# Patient Record
Sex: Male | Born: 1945 | Race: White | Hispanic: No | Marital: Married
Health system: Midwestern US, Academic
[De-identification: ages and names within clinical notes are randomized; demographics above are authoritative.]

---

## 2017-03-28 ENCOUNTER — Encounter: Admit: 2017-03-28 | Discharge: 2017-03-29

## 2017-03-28 DIAGNOSIS — R69 Illness, unspecified: Principal | ICD-10-CM

## 2017-05-30 ENCOUNTER — Encounter: Admit: 2017-05-30 | Discharge: 2017-05-31

## 2017-05-30 DIAGNOSIS — R69 Illness, unspecified: Principal | ICD-10-CM

## 2017-06-03 ENCOUNTER — Encounter: Admit: 2017-06-03 | Discharge: 2017-06-04

## 2017-06-03 DIAGNOSIS — R69 Illness, unspecified: Principal | ICD-10-CM

## 2017-06-30 ENCOUNTER — Encounter: Admit: 2017-06-30 | Discharge: 2017-06-30

## 2017-06-30 DIAGNOSIS — R69 Illness, unspecified: Principal | ICD-10-CM

## 2017-07-01 ENCOUNTER — Encounter: Admit: 2017-07-01 | Discharge: 2017-07-01

## 2017-07-01 DIAGNOSIS — R69 Illness, unspecified: Principal | ICD-10-CM

## 2017-07-08 ENCOUNTER — Encounter: Admit: 2017-07-08 | Discharge: 2017-07-08

## 2017-07-08 NOTE — Telephone Encounter
 Navigation Intake Assessment Document    Patient Name:  Chris Lambert  DOB:  11/04/1945  Insurance:   Francine Graven    Appointment Info:    Future Appointments  Date Time Provider Department Center   07/11/2017 8:30 AM Earl Many, MD [facility]      Diagnosis & Reason for Visit:  Right renal mass     Physician Info:  ??? Referring Physician:  Benito Mccreedy, MD   ??? PCP:  Dionne Milo, MD     Location of Films:  PACS    Location of Pathology:  None    History of Present Illness:    - 71 y/o male who is a former smoker in for CT lung screening and lesions found in both kidneys. Further imaging completed US renal and CT scan abd/pelvis, which noted a large mass in the right kidney which is concerning for renal cell carcinoma.   - 03/28/2017  CT SCAN LUNG SCREENING - Findings: Exophytic cystic-like lesions or masses are present in the upper poles of the kidneys which were also present on the prior CT scan. There is a dominant lesion at the left upper renal pole measuring approximately 29 x 26 mm in dimension which is minimally changed in size from prior study though the attenuation is mildly increased with the Hounsfield unit attenuation documented at 44.  Impression:  Lung RADS category is L1. No suspicious pulmonary nodule or mass. Mild centrilobular emphysema.   - 05/30/2017  US RENAL - Large solid mass at the right lower renal pole. Renal cell carcinoma is the diagnosis of exclusion, though the Wilms tumor could result in a similar appearance. Multiple bilateral renal cysts. No hydronephrosis. There is extensive postvoid residual in the urinary bladder. Prostate gland shows mass effect on the posterior inferior urinary bladder wall.   - 06/03/2017  CT SCAN ABD/PELVIS - There is a dominant mass in the lower pole of the right kidney corresponding to the solid mass lesion seen on the prior ultrasound. This is likely to represent a renal cell carcinoma. There are numerous hyperdense, isodense and hypodense renal cortical cysts bilaterally. There is no hydronephrosis.  There is no evidence of significant lymph node enlargement. There is cholelithiasis with small stones seen within the contracted gallbladder. There are degenerative changes of the lumbar spine. There is a low density area within the medial segment of the left hepatic lobe which  is nonspecific in appearance. A liver mass cannot be entirely ruled out.    Referred to [facility] for further evaluation and treatment. Past medical history includes CVA, HTN, GERD, HLD, COPD, CKD and CAD    Comments:  Fareedah Mahler R. , MS, RN spoke with patient and informed him of date, time and location of appointment. Patient verbalized understanding.Scheduling letter sent to home address via [employer].

## 2017-07-11 ENCOUNTER — Encounter: Admit: 2017-07-11 | Discharge: 2017-07-11 | Payer: MEDICARE

## 2017-07-11 ENCOUNTER — Encounter: Admit: 2017-07-11 | Discharge: 2017-07-11

## 2017-07-11 ENCOUNTER — Ambulatory Visit: Admit: 2017-07-11 | Discharge: 2017-07-12 | Payer: MEDICARE

## 2017-07-11 DIAGNOSIS — I639 Cerebral infarction, unspecified: ICD-10-CM

## 2017-07-11 DIAGNOSIS — I10 Essential (primary) hypertension: Principal | ICD-10-CM

## 2017-07-11 DIAGNOSIS — N2889 Other specified disorders of kidney and ureter: Principal | ICD-10-CM

## 2017-07-11 DIAGNOSIS — I519 Heart disease, unspecified: ICD-10-CM

## 2017-07-11 DIAGNOSIS — R972 Elevated prostate specific antigen [PSA]: ICD-10-CM

## 2017-07-11 DIAGNOSIS — J438 Other emphysema: ICD-10-CM

## 2017-07-11 MED ORDER — CEFAZOLIN INJ 1GM IVP
2 g | Freq: Once | INTRAVENOUS | 0 refills | Status: CN
Start: 2017-07-11 — End: ?

## 2017-07-11 NOTE — Assessment & Plan Note
I went through the cross sectional image findings with the patient and family.  We discussed the implications of the renal masses and the natural history of kidney cancer.  I explained that there is a high chance that the renal mass is kidney cancer. We went over an extensive discussion about the potential management of the renal mass, specifically partial nephrectomy (robotic, laparoscopic, open) and radical nephrectomy. We discussed that our goal will be to do this robotically and only removing the part of the kidney that is affected. The risks and benefits were discussed, he would like to proceed.  - to OR on 07/26/17 for right robotic partial nephrectomy, possible radical, possible open. Patient consented, case requested.

## 2017-07-11 NOTE — Progress Notes
Date of Service: 07/11/2017     Subjective:             Chris Lambert is a 71 y.o. male who presents for evaluation for right renal mass.    History of Present Illness  Patient is a 71 y.o. male w/ PMHx of COPD/emphysema, stroke, CKD, CAD s/p CABGx4, HTN and former smoker who was found to have lesions in both kidneys during a CT scan for lung screening. He follows Dr. Wyvonnia Lora at [facility] in Cardiology. Further imaging completed US renal and CT scan abd/pelvis, which noted a large mass in the right kidney concerning for renal cell carcinoma. On most recent CT scan on 06/03/17, the dominant mass in the right lower pole of kidney was again demonstrated, measuring about 6.8cm x 6cm. Today, he reports occasional left flank pain and fatigue but denies hematuria, fevers, weight loss, night sweats or bone pain. He denies a FHx of GU cancer. He has not had any surgeries other than CABG x 4. He is taking ASA 81mg  daily.      His most recent PSA was 6.78 on 12/07/16. He follows Dr. Lorriane Shire for this. 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 He is undergoing phlebotomies PRN for management. Cr 06/02/17 on 1.74.     Past Medical History:   Diagnosis Date   ??? Elevated PSA    ??? Heart disease    ??? Hypertension    ??? Other emphysema (HCC)    ??? Stroke [facility])        Past Surgical History:   Procedure Laterality Date   ??? HX CORONARY ARTERY BYPASS GRAFT         Family History   Problem Relation Age of Onset   ??? Cancer Mother    ??? Hypertension Mother    ??? Diabetes Sister    ??? Diabetes Maternal Grandmother    ??? Hypertension Maternal Grandmother    ??? Heart Disease Other         Mat. Uncles    ??? Hypertension Other        Current Outpatient Prescriptions   Medication Sig Dispense Refill   ??? ADVAIR HFA 230-21 mcg/actuation inhaler INHALE 2 PUFFS TWICE DAILY RINSE MOUTH AND THROAT AFTER USE  0   ??? amLODIPine (NORVASC) 10 mg tablet ??? aspirin-calcium carbonate 81 mg-300 mg calcium(777 mg) tab Take  by mouth.     ??? atenolol (TENORMIN) 100 mg tablet      ??? atorvastatin (LIPITOR) 20 mg tablet Take  by mouth.     ??? docusate (COLACE) 100 mg capsule Take 100 mg by mouth twice daily.     ??? finasteride (PROSCAR) 5 mg tablet      ??? furosemide (LASIX) 20 mg tablet      ??? hydroxyurea (HYDREA) 500 mg capsule Take 500 mg by mouth daily. Take at the same time every day.     ??? ipratropium/albuterol (COMBIVENT RESPIMAT) 20-100 mcg/actuation mist inhaler Inhale 1 puff by mouth into the lungs four times daily.     ??? losartan (COZAAR) 25 mg tablet Take  by mouth.     ??? multivit with minerals/lutein (MULTIVITAMIN 50 PLUS PO) Take  by mouth.     ??? ranitidine(+) (ZANTAC) 150 mg tablet Take  by mouth.     ??? tamsulosin (FLOMAX) 0.4 mg capsule      ??? traMADol (ULTRAM) 50 mg tablet Take  by mouth.  No current facility-administered medications for this visit.        No Known Allergies    Social History     Social History   ??? Marital status: Married     Spouse name: N/A   ??? Number of children: N/A   ??? Years of education: N/A     Occupational History   ??? Not on file.     Social History Main Topics   ??? Smoking status: Former Smoker     Quit date: 2012   ??? Smokeless tobacco: Never Used   ??? Alcohol use No      Comment: Quit in 1986    ??? Drug use: No   ??? Sexual activity: Not Currently     Partners: Female     Other Topics Concern   ??? Not on file     Social History Narrative   ??? No narrative on file       Review of Systems   Constitutional: Positive for fever. Negative for activity change, appetite change, chills, diaphoresis, fatigue and unexpected weight change.   HENT: Negative for congestion, hearing loss, mouth sores and sinus pressure.    Eyes: Negative for visual disturbance.   Respiratory: Negative for apnea, cough, chest tightness and shortness of breath.    Cardiovascular: Negative for chest pain, palpitations and leg swelling. Gastrointestinal: Negative for abdominal pain, blood in stool, constipation, diarrhea, nausea, rectal pain and vomiting.   Endocrine: Negative.    Genitourinary: Positive for flank pain. Negative for decreased urine volume, difficulty urinating, discharge, dysuria, enuresis, frequency, genital sores, hematuria, penile pain, penile swelling, scrotal swelling, testicular pain and urgency.   Musculoskeletal: Negative for arthralgias, back pain, gait problem and myalgias.   Skin: Negative for rash and wound.   Allergic/Immunologic: Negative.    Neurological: Negative for dizziness, tremors, seizures, syncope, weakness, light-headedness, numbness and headaches.   Hematological: Negative for adenopathy. Does not bruise/bleed easily.   Psychiatric/Behavioral: Negative for decreased concentration and dysphoric mood. The patient is not nervous/anxious.        Objective:         ??? ADVAIR HFA 230-21 mcg/actuation inhaler INHALE 2 PUFFS TWICE DAILY RINSE MOUTH AND THROAT AFTER USE   ??? amLODIPine (NORVASC) 10 mg tablet    ??? aspirin-calcium carbonate 81 mg-300 mg calcium(777 mg) tab Take  by mouth.   ??? atenolol (TENORMIN) 100 mg tablet    ??? atorvastatin (LIPITOR) 20 mg tablet Take  by mouth.   ??? docusate (COLACE) 100 mg capsule Take 100 mg by mouth twice daily.   ??? finasteride (PROSCAR) 5 mg tablet    ??? furosemide (LASIX) 20 mg tablet    ??? hydroxyurea (HYDREA) 500 mg capsule Take 500 mg by mouth daily. Take at the same time every day.   ??? ipratropium/albuterol (COMBIVENT RESPIMAT) 20-100 mcg/actuation mist inhaler Inhale 1 puff by mouth into the lungs four times daily.   ??? losartan (COZAAR) 25 mg tablet Take  by mouth.   ??? multivit with minerals/lutein (MULTIVITAMIN 50 PLUS PO) Take  by mouth.   ??? ranitidine(+) (ZANTAC) 150 mg tablet Take  by mouth.   ??? tamsulosin (FLOMAX) 0.4 mg capsule    ??? traMADol (ULTRAM) 50 mg tablet Take  by mouth.     Vitals:    07/11/17 0813   BP: 145/69   Pulse: 76   Weight: 98.9 kg (218 lb) Height: 177.8 cm (70)     Body mass index is 31.28 kg/m???.  Physical Exam   Constitutional: He is oriented to person, place, and time. He appears well-developed and well-nourished. No distress.   Obese   HENT:   Head: Normocephalic and atraumatic.   Nose: Nose normal.   Eyes: Pupils are equal, round, and reactive to light. Conjunctivae and EOM are normal. Right eye exhibits no discharge. Left eye exhibits no discharge. No scleral icterus.   Neck: Normal range of motion. No tracheal deviation present.   Cardiovascular: Normal rate and intact distal pulses.    Pulmonary/Chest: Effort normal and breath sounds normal. No stridor. No respiratory distress. He exhibits no tenderness.   Abdominal: Soft. Bowel sounds are normal. He exhibits no distension and no mass. There is no tenderness. There is no rebound and no guarding.   Umbilical hernia noted on exam, reducible.   Musculoskeletal: Normal range of motion. He exhibits no edema or deformity.   Neurological: He is alert and oriented to person, place, and time.   Skin: Skin is warm and dry. He is not diaphoretic. No pallor.   Psychiatric: He has a normal mood and affect. His behavior is normal. Judgment and thought content normal.        Assessment and Plan:    Problem   Right Renal Mass    - 03/28/2017  CT SCAN LUNG SCREENING - Findings: Exophytic cystic-like lesions or masses are present in the upper poles of the kidneys which were also present on the prior CT scan. There is a dominant lesion at the left upper renal pole measuring approximately 29 x 26 mm in dimension which is minimally changed in size from prior study though the attenuation is mildly increased with the Hounsfield unit attenuation documented at 44.  Impression:  Lung RADS category is L1. No suspicious pulmonary nodule or mass. Mild centrilobular emphysema.   - 05/30/2017  US RENAL - Large solid mass at the right lower renal pole. Renal cell carcinoma is the diagnosis of exclusion, though the Wilms tumor could result in a similar appearance. Multiple bilateral renal cysts. No hydronephrosis. There is extensive postvoid residual in the urinary bladder. Prostate gland shows mass effect on the posterior inferior urinary bladder wall.   - 06/03/2017  CT SCAN ABD/PELVIS - There is a dominant mass in the lower pole of the right kidney corresponding to the solid mass lesion seen on the prior ultrasound. This is likely to represent a renal cell carcinoma. There are numerous hyperdense, isodense and hypodense renal cortical cysts bilaterally. There is no hydronephrosis.  There is no evidence of significant lymph node enlargement. There is cholelithiasis with small stones seen within the contracted gallbladder. There are degenerative changes of the lumbar spine. There is a low density area within the medial segment of the left hepatic lobe which  is nonspecific in appearance. A liver mass cannot be entirely ruled out.         Right renal mass  I went through the cross sectional image findings with the patient and family.  We discussed the implications of the renal masses and the natural history of kidney cancer.  I explained that there is a high chance that the renal mass is kidney cancer. We went over an extensive discussion about the potential management of the renal mass, specifically partial nephrectomy (robotic, laparoscopic, open) and radical nephrectomy. We discussed that our goal will be to do this robotically and only removing the part of the kidney that is affected. The risks and benefits were discussed, he would like to  proceed.  - to OR on 07/26/17 for right robotic partial nephrectomy, possible radical, possible open. Patient consented, case requested.          Patient seen and discussed with Dr. Perlie Gold, who directed plan of care.    Minerva Ends, MD  PGY2 Urology       ATTESTATION I personally performed the key portions of the E/M visit, discussed case with resident and concur with resident documentation of history, physical exam, assessment, and treatment plan unless otherwise noted.    Patient referred to me by Dr. Larwance Rote for further evaluation and treatment of a newly discovered right renal mass concerning for renal cell carcinoma.  We discussed the findings of the mass, the differential diagnosis, and treatment options.  He does have baseline renal insufficiency (baseline creatinine 1.7-1.8).  The mass is on the larger size of masses amenable to a partial nephrectomy, but it is in a favorable location.  He also has multiple cysts on his bilateral kidneys.  After discussion, patient would like to proceed with a right robotic versus open partial nephrectomy with possible radical nephrectomy.  We will also perform some cyst decortications.  He will need cardiology clearance.  All risks and benefits of the procedure were discussed with the patient.  All questions answered.  Consent obtained.  Complex counseling.  We will schedule surgery in the near future.    Staff name:  Earl Many, MD Date:  07/11/2017

## 2017-07-12 DIAGNOSIS — N2889 Other specified disorders of kidney and ureter: Secondary | ICD-10-CM

## 2017-07-12 LAB — CULTURE-URINE W/SENSITIVITY

## 2017-07-21 ENCOUNTER — Encounter: Admit: 2017-07-21 | Discharge: 2017-07-21

## 2017-07-21 ENCOUNTER — Ambulatory Visit: Admit: 2017-07-21 | Discharge: 2017-07-22 | Payer: MEDICARE

## 2017-07-21 DIAGNOSIS — I639 Cerebral infarction, unspecified: ICD-10-CM

## 2017-07-21 DIAGNOSIS — N2889 Other specified disorders of kidney and ureter: ICD-10-CM

## 2017-07-21 DIAGNOSIS — I10 Essential (primary) hypertension: Principal | ICD-10-CM

## 2017-07-21 DIAGNOSIS — N189 Chronic kidney disease, unspecified: ICD-10-CM

## 2017-07-21 DIAGNOSIS — R972 Elevated prostate specific antigen [PSA]: ICD-10-CM

## 2017-07-21 DIAGNOSIS — I519 Heart disease, unspecified: ICD-10-CM

## 2017-07-21 DIAGNOSIS — E785 Hyperlipidemia, unspecified: ICD-10-CM

## 2017-07-21 DIAGNOSIS — J438 Other emphysema: ICD-10-CM

## 2017-07-21 DIAGNOSIS — D751 Secondary polycythemia: ICD-10-CM

## 2017-07-21 DIAGNOSIS — K929 Disease of digestive system, unspecified: ICD-10-CM

## 2017-07-21 DIAGNOSIS — I499 Cardiac arrhythmia, unspecified: ICD-10-CM

## 2017-07-21 DIAGNOSIS — I2510 Atherosclerotic heart disease of native coronary artery without angina pectoris: ICD-10-CM

## 2017-07-21 LAB — COMPREHENSIVE METABOLIC PANEL: Lab: 136 MMOL/L — ABNORMAL LOW (ref 137–147)

## 2017-07-21 LAB — CBC
Lab: 14 g/dL — ABNORMAL HIGH (ref 13.5–16.5)
Lab: 4.4 M/UL (ref 4.4–5.5)
Lab: 44 % — ABNORMAL HIGH (ref 40–50)
Lab: 9.4 10*3/uL (ref 4.5–11.0)

## 2017-07-21 NOTE — Pre-Anesthesia Patient Instructions
GENERAL INFORMATION    Before you come to the hospital  ??? Make arrangements for a responsible adult to drive you home and stay with you for 24 hours following surgery.  ??? Bath/Shower Instructions  ??? Take a bath or shower using the special soap given to you in PAC. Use half the bottle the night before, and the other half the morning of your procedure. Use clean towels with each bath or shower.  ??? Put on clean clothes after bath or shower.  Avoid using lotion and oils.  ??? Sleep on clean sheets if bath or shower is done the night before procedure.  ??? Leave money, credit cards, jewelry, and any other valuables at home. The [facility] is not responsible for the loss or breakage of personal items.  ??? Remove nail polish, makeup and all jewelry (including piercings) before coming to the hospital.  ??? The morning of your procedure:  ??? brush your teeth and tongue  ??? do not smoke  ??? do not shave the area where you will have surgery    What to bring to the hospital  ??? ID/ Insurance Card  ??? Medical Device card  ??? Copy of your Living Will, Advanced Directives, and/or Durable Power of Attorney   ??? Small bag with a few personal belongings  ??? CPAP/BiPAP machine (including all supplies)  ??? Walker,cane, or motorized scooter  ??? Cases for glasses/hearing aids/contact lens (bring solutions for contacts)  ??? ***  ??? Dress in clean, loose, comfortable clothing     Eating or drinking before surgery  ??? Do not eat or drink anything after 11:00 p.m. the day before your procedure (including gum, mints, candy, or chewing tobacco) OR follow the specific instructions you were given by your Surgeon.  ??? You may have WATER ONLY up to 2 hours before arriving at the hospital.       Other instructions  Notify your surgeon if:    ??? you become ill with a cough, fever, sore throat, nausea, vomiting or flu-like symptoms  ??? you have any open wounds/sores that are red, painful, draining, or are new since you last saw  the doctor ??? you need to cancel your procedure  ??? You will receive a call with your surgery arrival time from between 2:30pm and 4:30pm the last business day before your procedure.  If you do not receive a call, please call [phone] before 4:30pm or [phone] after 4:30pm.    Notify us at [facility]: [phone]  ??? if you need to cancel your procedure  ??? if you are going to be late    Arrival at the hospital    [facility]  [address]  [place], [place] [address]    ??? Park in the [employer], located directly across from the main entrance to the hospital.  ??? Valet parking is available  from 7 AM to 4 PM Monday through Friday.  ??? Enter through the ground floor main hospital entrance and check in at the Information Desk in the lobby.  ??? They will validate your parking ticket and direct you to the next location.  ??? If you are a woman between the ages of 8 and 61, and have not had a hysterectomy, you will be asked for a urine sample prior to surgery.  Please do not urinate before arriving in the Surgery Waiting Room.  Once there, check in and let the attendant know if you need  to provide a sample.

## 2017-07-22 DIAGNOSIS — Z01810 Encounter for preprocedural cardiovascular examination: ICD-10-CM

## 2017-07-22 DIAGNOSIS — N2889 Other specified disorders of kidney and ureter: Principal | ICD-10-CM

## 2017-07-22 NOTE — Progress Notes
[facility] Pharmacist Medication Plan Note:    Chris Lambert was seen in the [facility] on 07/21/17.  As part of the visit, an accurate medication list was obtained and the patient was given pre-op medication instructions for upcoming surgery on 07/26/17.      Per Renee with Dr. Lolita Lenz and Echo with Dr. Wonda Cerise, the patient may continue to hold aspirin prior to surgery. Last dose was on 07/11/17. Per Echo with Dr. Wonda Cerise, the patient may continue to hold hydroxyurea prior to surgery. Last dose was on 07/11/17.    The plan above was communicated to the patient who verbalized understanding.    Hamilton Capri, PHARMD

## 2017-07-26 ENCOUNTER — Encounter: Admit: 2017-07-26 | Discharge: 2017-07-26

## 2017-07-26 DIAGNOSIS — R972 Elevated prostate specific antigen [PSA]: ICD-10-CM

## 2017-07-26 DIAGNOSIS — N2889 Other specified disorders of kidney and ureter: ICD-10-CM

## 2017-07-26 DIAGNOSIS — I639 Cerebral infarction, unspecified: ICD-10-CM

## 2017-07-26 DIAGNOSIS — I499 Cardiac arrhythmia, unspecified: ICD-10-CM

## 2017-07-26 DIAGNOSIS — J438 Other emphysema: ICD-10-CM

## 2017-07-26 DIAGNOSIS — I10 Essential (primary) hypertension: Principal | ICD-10-CM

## 2017-07-26 DIAGNOSIS — D751 Secondary polycythemia: ICD-10-CM

## 2017-07-26 DIAGNOSIS — E785 Hyperlipidemia, unspecified: ICD-10-CM

## 2017-07-26 DIAGNOSIS — I2510 Atherosclerotic heart disease of native coronary artery without angina pectoris: ICD-10-CM

## 2017-07-26 DIAGNOSIS — N189 Chronic kidney disease, unspecified: ICD-10-CM

## 2017-07-26 DIAGNOSIS — I519 Heart disease, unspecified: ICD-10-CM

## 2017-07-26 DIAGNOSIS — K929 Disease of digestive system, unspecified: ICD-10-CM

## 2017-07-26 MED ORDER — DIPHENHYDRAMINE HCL 50 MG/ML IJ SOLN
25 mg | Freq: Once | INTRAVENOUS | 0 refills | Status: DC | PRN
Start: 2017-07-26 — End: 2017-07-27

## 2017-07-26 MED ORDER — FENTANYL CITRATE (PF) 50 MCG/ML IJ SOLN
0 refills | Status: DC
Start: 2017-07-26 — End: 2017-07-27
  Administered 2017-07-26: 19:00:00 50 ug via INTRAVENOUS
  Administered 2017-07-26: 25 ug via INTRAVENOUS
  Administered 2017-07-26: 22:00:00 50 ug via INTRAVENOUS
  Administered 2017-07-26: 25 ug via INTRAVENOUS

## 2017-07-26 MED ORDER — LIDOCAINE (PF) 10 MG/ML (1 %) IJ SOLN
.1-2 mL | INTRAMUSCULAR | 0 refills | Status: DC | PRN
Start: 2017-07-26 — End: 2017-07-27

## 2017-07-26 MED ORDER — HYDROXYUREA 500 MG PO CAP
500 mg | Freq: Every day | ORAL | 0 refills | Status: DC
Start: 2017-07-26 — End: 2017-07-29
  Administered 2017-07-27 – 2017-07-29 (×3): 500 mg via ORAL

## 2017-07-26 MED ORDER — TRAMADOL 50 MG PO TAB
50 mg | ORAL | 0 refills | Status: DC | PRN
Start: 2017-07-26 — End: 2017-07-29
  Administered 2017-07-27 – 2017-07-29 (×3): 50 mg via ORAL

## 2017-07-26 MED ORDER — HALOPERIDOL LACTATE 5 MG/ML IJ SOLN
1 mg | Freq: Once | INTRAVENOUS | 0 refills | Status: DC | PRN
Start: 2017-07-26 — End: 2017-07-27

## 2017-07-26 MED ORDER — FENTANYL CITRATE (PF) 50 MCG/ML IJ SOLN
50 ug | INTRAVENOUS | 0 refills | Status: DC | PRN
Start: 2017-07-26 — End: 2017-07-27
  Administered 2017-07-27: 01:00:00 50 ug via INTRAVENOUS
  Administered 2017-07-27: 01:00:00 25 ug via INTRAVENOUS
  Administered 2017-07-27: 01:00:00 50 ug via INTRAVENOUS
  Administered 2017-07-27 (×3): 25 ug via INTRAVENOUS

## 2017-07-26 MED ORDER — HYDROMORPHONE (PF) 2 MG/ML IJ SYRG
.5 mg | INTRAVENOUS | 0 refills | Status: DC | PRN
Start: 2017-07-26 — End: 2017-07-27

## 2017-07-26 MED ORDER — PROMETHAZINE 25 MG/ML IJ SOLN
6.25 mg | INTRAVENOUS | 0 refills | Status: DC | PRN
Start: 2017-07-26 — End: 2017-07-27

## 2017-07-26 MED ORDER — FENTANYL CITRATE (PF) 50 MCG/ML IJ SOLN
25-50 ug | INTRAVENOUS | 0 refills | Status: DC | PRN
Start: 2017-07-26 — End: 2017-07-27
  Administered 2017-07-27: 03:00:00 50 ug via INTRAVENOUS

## 2017-07-26 MED ORDER — ROCURONIUM 10 MG/ML IV SOLN
INTRAVENOUS | 0 refills | Status: DC
Start: 2017-07-26 — End: 2017-07-27
  Administered 2017-07-26 (×3): 10 mg via INTRAVENOUS
  Administered 2017-07-26: 19:00:00 50 mg via INTRAVENOUS
  Administered 2017-07-26 (×2): 10 mg via INTRAVENOUS

## 2017-07-26 MED ORDER — CEFAZOLIN INJ 1GM IVP
1 g | INTRAVENOUS | 0 refills | Status: CP
Start: 2017-07-26 — End: ?
  Administered 2017-07-27 (×3): 1 g via INTRAVENOUS

## 2017-07-26 MED ORDER — OXYCODONE 5 MG PO TAB
5-10 mg | Freq: Once | ORAL | 0 refills | Status: CP | PRN
Start: 2017-07-26 — End: ?
  Administered 2017-07-27: 10 mg via ORAL

## 2017-07-26 MED ORDER — SODIUM CHLORIDE 0.9 % IV SOLP
1000 mL | INTRAVENOUS | 0 refills | Status: DC
Start: 2017-07-26 — End: 2017-07-27

## 2017-07-26 MED ORDER — SENNOSIDES-DOCUSATE SODIUM 8.6-50 MG PO TAB
1 | Freq: Two times a day (BID) | ORAL | 0 refills | Status: DC
Start: 2017-07-26 — End: 2017-07-29
  Administered 2017-07-27 – 2017-07-29 (×4): 1 via ORAL

## 2017-07-26 MED ORDER — ATENOLOL 50 MG PO TAB
100 mg | Freq: Every day | ORAL | 0 refills | Status: DC
Start: 2017-07-26 — End: 2017-07-29
  Administered 2017-07-27 – 2017-07-29 (×3): 100 mg via ORAL

## 2017-07-26 MED ORDER — ATORVASTATIN 20 MG PO TAB
20 mg | Freq: Every evening | ORAL | 0 refills | Status: DC
Start: 2017-07-26 — End: 2017-07-29
  Administered 2017-07-27 – 2017-07-29 (×3): 20 mg via ORAL

## 2017-07-26 MED ORDER — LIDOCAINE (PF) 200 MG/10 ML (2 %) IJ SYRG
0 refills | Status: DC
Start: 2017-07-26 — End: 2017-07-27
  Administered 2017-07-26: 19:00:00 80 mg via INTRAVENOUS

## 2017-07-26 MED ORDER — DEXTRAN 70-HYPROMELLOSE (PF) 0.1-0.3 % OP DPET
0 refills | Status: DC
Start: 2017-07-26 — End: 2017-07-27
  Administered 2017-07-26: 19:00:00 2 [drp] via OPHTHALMIC

## 2017-07-26 MED ORDER — ACETAMINOPHEN 325 MG PO TAB
650 mg | Freq: Once | ORAL | 0 refills | Status: CP
Start: 2017-07-26 — End: ?
  Administered 2017-07-26: 18:00:00 650 mg via ORAL

## 2017-07-26 MED ORDER — ALBUTEROL SULFATE 2.5 MG/0.5 ML IN NEBU
2.5 mg | RESPIRATORY_TRACT | 0 refills | Status: DC | PRN
Start: 2017-07-26 — End: 2017-07-29

## 2017-07-26 MED ORDER — PROPOFOL INJ 10 MG/ML IV VIAL
0 refills | Status: DC
Start: 2017-07-26 — End: 2017-07-27
  Administered 2017-07-26: 19:00:00 40 mg via INTRAVENOUS
  Administered 2017-07-26: 19:00:00 100 mg via INTRAVENOUS

## 2017-07-26 MED ORDER — LACTATED RINGERS IV SOLP
0 refills | Status: DC
Start: 2017-07-26 — End: 2017-07-27
  Administered 2017-07-26: 23:00:00 via INTRAVENOUS

## 2017-07-26 MED ORDER — LOSARTAN 25 MG PO TAB
12.5 mg | Freq: Every day | ORAL | 0 refills | Status: DC
Start: 2017-07-26 — End: 2017-07-27

## 2017-07-26 MED ORDER — ONDANSETRON HCL (PF) 4 MG/2 ML IJ SOLN
4-8 mg | INTRAVENOUS | 0 refills | Status: DC | PRN
Start: 2017-07-26 — End: 2017-07-29
  Administered 2017-07-27 – 2017-07-28 (×7): 4 mg via INTRAVENOUS

## 2017-07-26 MED ORDER — POLYETHYLENE GLYCOL 3350 17 GRAM PO PWPK
1 | Freq: Two times a day (BID) | ORAL | 0 refills | Status: DC
Start: 2017-07-26 — End: 2017-07-29
  Administered 2017-07-27 – 2017-07-29 (×6): 17 g via ORAL

## 2017-07-26 MED ORDER — TAMSULOSIN 0.4 MG PO CAP
.4 mg | Freq: Every day | ORAL | 0 refills | Status: DC
Start: 2017-07-26 — End: 2017-07-29
  Administered 2017-07-27 – 2017-07-28 (×3): 0.4 mg via ORAL

## 2017-07-26 MED ORDER — SUGAMMADEX 100 MG/ML IV SOLN
INTRAVENOUS | 0 refills | Status: DC
Start: 2017-07-26 — End: 2017-07-27
  Administered 2017-07-26: 200 mg via INTRAVENOUS

## 2017-07-26 MED ORDER — SODIUM CHLORIDE 0.9 % IV SOLP
INTRAVENOUS | 0 refills | Status: DC
Start: 2017-07-26 — End: 2017-07-29
  Administered 2017-07-27 – 2017-07-28 (×3): 1000.000 mL via INTRAVENOUS

## 2017-07-26 MED ORDER — FAMOTIDINE 20 MG PO TAB
20 mg | Freq: Two times a day (BID) | ORAL | 0 refills | Status: DC
Start: 2017-07-26 — End: 2017-07-28
  Administered 2017-07-27 – 2017-07-28 (×3): 20 mg via ORAL

## 2017-07-26 MED ORDER — GABAPENTIN 100 MG PO CAP
200 mg | Freq: Once | ORAL | 0 refills | Status: CP
Start: 2017-07-26 — End: ?
  Administered 2017-07-26: 18:00:00 200 mg via ORAL

## 2017-07-26 MED ORDER — OXYCODONE 5 MG PO TAB
5-10 mg | ORAL | 0 refills | Status: DC | PRN
Start: 2017-07-26 — End: 2017-07-29
  Administered 2017-07-27 – 2017-07-28 (×6): 10 mg via ORAL
  Administered 2017-07-29: 17:00:00 5 mg via ORAL
  Administered 2017-07-29: 01:00:00 10 mg via ORAL

## 2017-07-26 MED ORDER — FLUTICASONE-SALMETEROL 100-50 MCG/DOSE IN DSDV
1 | Freq: Two times a day (BID) | RESPIRATORY_TRACT | 0 refills | Status: DC
Start: 2017-07-26 — End: 2017-07-29
  Administered 2017-07-27: 04:00:00 1 via RESPIRATORY_TRACT

## 2017-07-26 MED ORDER — ACETAMINOPHEN 325 MG PO TAB
650 mg | ORAL | 0 refills | Status: DC
Start: 2017-07-26 — End: 2017-07-29
  Administered 2017-07-27 – 2017-07-29 (×10): 650 mg via ORAL

## 2017-07-26 MED ORDER — ELECTROLYTE-A IV SOLP
0 refills | Status: DC
Start: 2017-07-26 — End: 2017-07-27
  Administered 2017-07-26 (×2): via INTRAVENOUS

## 2017-07-26 MED ORDER — AMLODIPINE 10 MG PO TAB
10 mg | Freq: Every day | ORAL | 0 refills | Status: DC
Start: 2017-07-26 — End: 2017-07-29
  Administered 2017-07-27 – 2017-07-29 (×3): 10 mg via ORAL

## 2017-07-26 MED ORDER — LACTATED RINGERS IV SOLP
INTRAVENOUS | 0 refills | Status: DC
Start: 2017-07-26 — End: 2017-07-27

## 2017-07-26 MED ORDER — FINASTERIDE 5 MG PO TAB
5 mg | Freq: Every day | ORAL | 0 refills | Status: DC
Start: 2017-07-26 — End: 2017-07-29
  Administered 2017-07-27 – 2017-07-29 (×3): 5 mg via ORAL

## 2017-07-26 MED ORDER — ACETAMINOPHEN 500 MG PO TAB
1000 mg | Freq: Once | ORAL | 0 refills | Status: CP
Start: 2017-07-26 — End: ?
  Administered 2017-07-27: 01:00:00 1000 mg via ORAL

## 2017-07-26 MED ORDER — EPHEDRINE SULFATE 50 MG/5ML SYR (10 MG/ML) (AN)(OSM)
0 refills | Status: DC
Start: 2017-07-26 — End: 2017-07-27
  Administered 2017-07-26 (×2): 10 mg via INTRAVENOUS

## 2017-07-26 MED ORDER — BUPIVACAINE (PF) 0.25 % (2.5 MG/ML) IJ SOLN
0 refills | Status: DC
Start: 2017-07-26 — End: 2017-07-26
  Administered 2017-07-26: 23:00:00 50 mL via INTRAMUSCULAR

## 2017-07-26 MED ORDER — ONDANSETRON HCL (PF) 4 MG/2 ML IJ SOLN
INTRAVENOUS | 0 refills | Status: DC
Start: 2017-07-26 — End: 2017-07-27
  Administered 2017-07-26: 23:00:00 4 mg via INTRAVENOUS

## 2017-07-26 MED ORDER — IPRATROPIUM BROMIDE 0.02 % IN SOLN
0.5 mg | RESPIRATORY_TRACT | 0 refills | Status: DC | PRN
Start: 2017-07-26 — End: 2017-07-29

## 2017-07-26 MED ORDER — CEFAZOLIN INJ 1GM IVP
2 g | Freq: Once | INTRAVENOUS | 0 refills | Status: CP
Start: 2017-07-26 — End: ?
  Administered 2017-07-26 (×2): 2 g via INTRAVENOUS

## 2017-07-26 MED ADMIN — SODIUM CHLORIDE 0.9 % IV SOLP [27838]: 1000 mL | INTRAVENOUS | @ 18:00:00 | Stop: 2017-07-26 | NDC 00338004904

## 2017-07-26 NOTE — Anesthesia Procedure Notes
Anesthesia Procedure: Arterial Line Placement    A-LINE INSERTION  Date/Time: 07/26/2017 1:16 PM    Patient location: OR  Indications: frequent labs and hemodynamic monitoring      Preprocedure checklist performed: 2 patient identifiers, risks & benefits discussed, patient evaluated, timeout performed, consent obtained, patient being monitored and sterile drape    Sterile technique:  - Proper hand washing  - Cap, mask  - Sterile gloves  - Skin prep for antisepsis        Arterial Line Procedure   Patient sedated: yes (see MAR)  Sedation type: general;   Artery prepped with chlorhexidine; skin prep agent completely dried prior to procedure.  Location: radial artery  Laterality: left  Technique: palpation and anatomical landmarks  Needle gauge: 20 G  Number of attempts: 2    Procedure Outcome  Catheter secured with adhesive dressing applied  Events: no complications noted during insertion and skin intact, warm, and dry    Observation: pt tolerated well        Performed by: Fleet Contras, Katianne Barre  Authorized by: Casilda Carls

## 2017-07-27 LAB — BASIC METABOLIC PANEL
Lab: 105 MMOL/L (ref 98–110)
Lab: 11 pg (ref 3–12)
Lab: 139 MMOL/L — ABNORMAL LOW (ref 137–147)
Lab: 153 mg/dL — ABNORMAL HIGH (ref 70–100)
Lab: 2.1 mg/dL — ABNORMAL HIGH (ref 0.4–1.24)
Lab: 23 MMOL/L (ref 21–30)
Lab: 30 mL/min — ABNORMAL LOW (ref >60)
Lab: 31 mg/dL — ABNORMAL HIGH (ref 7–25)
Lab: 37 mL/min — ABNORMAL LOW (ref >60)
Lab: 5 MMOL/L (ref 3.5–5.1)
Lab: 9.4 mg/dL (ref 8.5–10.6)
Lab: 134 MMOL/L — ABNORMAL LOW (ref >60)

## 2017-07-27 LAB — CBC
Lab: 18 10*3/uL — ABNORMAL HIGH (ref 4.5–11.0)
Lab: 12 K/UL — ABNORMAL HIGH (ref >60)

## 2017-07-27 MED ORDER — SODIUM CHLORIDE 0.9 % IV SOLP
1000 mL | INTRAVENOUS | 0 refills | Status: CP
Start: 2017-07-27 — End: ?
  Administered 2017-07-27: 15:00:00 1000 mL via INTRAVENOUS

## 2017-07-27 NOTE — Anesthesia Pain Rounding
Anesthesia Follow-Up Evaluation: Post-Procedure Day One    Name: Chris Lambert     MRN: [id number]     DOB: July 25, 1946     Age: 71 y.o.     Sex: male   __________________________________________________________________________     Procedure Date: 07/26/2017   Procedure: Procedure(s) with comments:  ROBOT-ASSISTED LAPAROSCOPIC RADICAL NEPHRECTOMY; INTRAOPERATIVE ULTRASOUND. - CASE LENGTH 4 HOURS, PLEASE CLIP PT IN SDS/PRE-POST, REQUEST FIREFLY, INTRA-OP SONO, ARGON, FROZEN SECTION REQUIRED    Physical Assessment  Height: 177.8 cm (70)  Weight: 99.1 kg (218 lb 7.6 oz)    Vital Signs (Last Filed in 24 hours)  BP: 137/70 (12/05 0820)  Temp: 36.7 ???C (98.1 ???F) (12/05 0820)  Pulse: 82 (12/05 0820)  Respirations: 20 PER MINUTE (12/05 0820)  SpO2: 92 % (12/05 0820)  O2 Delivery: None (Room Air) (12/05 0820)  SpO2 Pulse: 79 (12/04 1930)  Height: 177.8 cm (70) (12/04 1130)    Patient History   Allergies  No Known Allergies     Medications  Scheduled Meds:  acetaminophen (TYLENOL) tablet 650 mg 650 mg Oral Q6H*   amLODIPine (NORVASC) tablet 10 mg 10 mg Oral QDAY w/breakfast   atenolol (TENORMIN) tablet 100 mg 100 mg Oral QDAY w/breakfast   atorvastatin (LIPITOR) tablet 20 mg 20 mg Oral QHS   ceFAZolin (ANCEF) IVP 1 g 1 g Intravenous Q8H*   famotidine (PEPCID) tablet 20 mg 20 mg Oral BID   finasteride (PROSCAR) tablet 5 mg 5 mg Oral QDAY w/breakfast   fluticasone/salmeterol (ADVAIR DISKUS) 100-50 mcg inhalation disk 1 puff 1 puff Inhalation BID   hydroxyurea (HYDREA) capsule 500 mg 500 mg Oral QDAY w/breakfast   polyethylene glycol 3350 (MIRALAX) packet 17 g 1 packet Oral BID   senna/docusate (SENOKOT-S) tablet 1 tablet 1 tablet Oral BID   tamsulosin (FLOMAX) capsule 0.4 mg 0.4 mg Oral QDAY w/dinner   Continuous Infusions:  ??? sodium chloride 0.9 %   infusion 100 mL/hr at 07/26/17 1840     PRN and Respiratory Meds:albuterol 0.5% Q4H PRN, ipratropium bromide Q4H PRN, ondansetron (ZOFRAN) IV Q4H PRN, oxyCODONE Q6H PRN, traMADol Q6H PRN      Diagnostic Tests  Hematology: Lab Results   Component Value Date    HGB 13.4 07/27/2017    HCT 40.2 07/27/2017    PLTCT 320 07/27/2017    WBC 12.1 07/27/2017    MCV 100.0 07/27/2017    MCH 33.3 07/27/2017    MCHC 33.3 07/27/2017    MPV 8.2 07/27/2017    RDW 16.7 07/27/2017         General Chemistry: Lab Results   Component Value Date    NA 134 07/27/2017    K 4.7 07/27/2017    CL 102 07/27/2017    CO2 23 07/27/2017    GAP 9 07/27/2017    BUN 36 07/27/2017    CR 2.56 07/27/2017    GLU 114 07/27/2017    CA 9.2 07/27/2017    ALBUMIN 4.4 07/21/2017    TOTBILI 0.6 07/21/2017      Coagulation: No results found for: PT, PTT, INR      Follow-Up Assessment  Patient location during evaluation: floor      Anesthetic Complications:   Anesthetic complications: The patient did not experience any anesthestic complications.      Pain:    Management:inadequate (RN managing pain)     Level of Consciousness: awake and alert   Hydration:acceptable     Airway Patency: patent   Respiratory Status:  acceptable     Cardiovascular Status:acceptable   Regional/Neuroaxial:

## 2017-07-27 NOTE — Progress Notes
RT Adult Assessment Note    NAME:Chris Lambert             MRN: [id number]             DOB:[birth year]          AGE: 71 y.o.  ADMISSION DATE: 07/26/2017             DAYS ADMITTED: LOS: 0 days    RT Treatment Plan:  Protocol Plan: Medications  Albuterol/Ipratropium: Neb PRN (pt states this is home regimen)    Protocol Plan: Procedures  IPPB: Place a nursing order for "IS Q1h While Awake" for any of Lung Expansion indicators  Oxygen/Humidity: O2 to keep SpO2 > 95%  Monitoring: Pulse oximetry BID & PRN    Additional Comments:  Impressions of the patient: Pt on RA, laying comfortably in bed talking with visitor at bedside. NAD.   Intervention(s)/outcome(s): RT evaluation completed, Advair given per order. A/A ordered per home regimen.   Patient education that was completed: How to Advair DPI vs. his home inhaler.   Recommendations to the care team: none    Vital Signs:  Pulse: Pulse: 85  RR: Respirations: 18 PER MINUTE  SpO2: SpO2: 95 %  O2 Device:    Liter Flow:    O2%: O2 Percent: 21 %  Breath Sounds: All Breath Sounds: Clear (implies normal)  Respiratory Effort: Respiratory Effort: Non-Labored

## 2017-07-27 NOTE — Case Management (ED)
Case Management Admission Assessment    NAME:Chris Lambert                          MRN: [id number]             DOB:July 17, 1946          AGE: 71 y.o.  ADMISSION DATE: 07/26/2017             DAYS ADMITTED: LOS: 0 days      Today???s Date: 07/27/2017    Source of Information: Patient & Spouse, Chris Lambert met with pt and Chris Lambert at bedside, introduced self, and explained SW/NCM roles. SW completed the below listed CM assessment with pt and pt denies any other needs at this time. SW to continue to follow.         Plan  Plan: CM Assessment, Assist PRN with SW/NCM Services    Patient Address/Phone  [address]  [place] [address]  [phone] (home)     Emergency Contact  Extended Emergency Contact Information  Primary Emergency Contact: Chay, [place]  Home Phone: [phone]  Relation: Spouse    Healthcare Directive  Healthcare Directive: Yes, patient has a healthcare directive  Type of Healthcare Directive: Durable power of attorney for healthcare, Healthcare directive  Location of Healthcare Directive: Patient does not have it with him/her  Would patient like to fill out a (a new) Editor, commissioning?: N/A  Psych Advance Directive (Psych unit only): No, patient does not have a Social research officer, government  Does the patient need discharge transport arranged?: No  Transportation Name, Phone and Availability #1: Sandor Arboleda; [phone]  Does the patient use Medicaid Transportation?: No   Chris Lambert confirmed she will provide pt transportation upon d/c.    Expected Discharge Date  Expected Discharge Date: 07/28/17    Living Situation Prior to Admission  ? Living Arrangements  Type of Residence: Home, independent  Living Arrangements: Spouse/significant other  How many levels in the residence?: 2  Can patient live on one level if needed?: Yes  Does residence have entry and/or side stairs?: Yes (2 entry steps)  Assistance needed prior to admit or anticipated on discharge: No  ? Level of Function Prior level of function: Independent   Pt states he was independent with ADL's prior to admission.  ? Cognitive Abilities   Cognitive Abilities: Alert and Oriented, Engages in problem solving and planning, Participates in decision making    Financial Resources  ? Coverage  Primary Insurance: Medicare Replacement ([employer] Medicare)  Secondary Insurance: No insurance  Additional Coverage: [facility] in [place], [place])    Pt denies any difficulty obtaining medications prior to admission.  ? Source of Income   Source Of Income: Other retirement income  ? Financial Assistance Needed?  No    Psychosocial Needs  ? Mental Health  Mental Health History: No   Pt denies any history of mental health.  ? Substance Use History  Substance Use History Screen: In the past   Pt states he has a history of alcohol abuse and quit drinking 40 years ago. Pt reports he stopped drinking on his own but did attend AA for awhile. Pt denies any current drug or alcohol use.  ? Other      Current/Previous Services  ? PCP  Steva Ready, [phone], [phone]  ? Pharmacy    [facility] - [place], [place] -  [address] [place] [address] [place] [address]  [place] [address]  Phone: [phone] Fax: [phone]    [facility])  [phone]  [place] [address]  Phone: [phone] Fax: [phone]    ? Durable Medical Equipment   Durable Medical Equipment at home: Dan Humphreys   Pt denies utilizing any DME prior to admission.  ? Home Health  Receiving home health: In the past  Agency name: Unable to recall-was set up from [facility] in 2015  Would patient use this agency again?: Yes  ? Hemodialysis or Peritoneal Dialysis     ? Tube/Enteral Feeds     ? Infusion     ? Private Duty     ? Home and [employer]     ? [facility]     ? Hospice     ? Outpatient Therapy     ? Skilled Nursing Facility/Nursing Home  SNF: In the past  When did patient receive care?: 2015 Name of Facility: [facility] [place]  Would patient return for future services?: Yes  NH: No  ? Inpatient Rehab  IPR: No  ? Long-Term Acute Care Hospital  LTACH: No  ? Acute Hospital Stay  Acute Hospital Stay: No    Diona Fanti, LMSW  [phone]

## 2017-07-27 NOTE — Anesthesia Post-Procedure Evaluation
Post-Anesthesia Evaluation    Name: Chris Lambert      MRN: [id number]     DOB: Nov 13, 1945     Age: 71 y.o.     Sex: male   __________________________________________________________________________     Procedure Date: 07/26/2017  Procedure: Procedure(s) with comments:  ROBOT-ASSISTED LAPAROSCOPIC RADICAL NEPHRECTOMY; INTRAOPERATIVE ULTRASOUND. - CASE LENGTH 4 HOURS, PLEASE CLIP PT IN SDS/PRE-POST, REQUEST FIREFLY, INTRA-OP SONO, ARGON, FROZEN SECTION REQUIRED      Surgeon: Surgeon(s):  Terie Purser, MD  Ellene Route, MD    Post-Anesthesia Vitals  BP: 144/57 (12/04 2010)  Temp: 36.7 C (98 F) (12/04 2010)  Pulse: 85 (12/04 2010)  Respirations: 16 PER MINUTE (12/04 2010)  SpO2: 96 % (12/04 2010)  O2 Delivery: None (Room Air) (12/04 2010)      Post Anesthesia Evaluation Note    Evaluation location: Pre/Post  Patient participation: recovered; patient participated in evaluation  Level of consciousness: alert    Pain score: 3  Pain management: adequate    Hydration: normovolemia  Temperature: 36.0C - 38.4C  Airway patency: adequate    Perioperative Events  Perioperative events:  no       Post-op nausea and vomiting: no PONV    Postoperative Status  Cardiovascular status: hemodynamically stable  Respiratory status: spontaneous ventilation  Follow-up needed: none        Perioperative Events  Perioperative Event: No  Emergency Case Activation: No

## 2017-07-27 NOTE — Progress Notes
Patient arrived to room # [id number]) via cart accompanied by transport. Patient transferred to the bed with assistance. Bedside safety checks completed. Initial patient assessment completed, refer to flowsheet for details. Admission skin assessment completed by: Rosaland Lao, RN    Pressure Injury Present on Hospital Admission (within 24 hours): No    1. Occiput: No  2. Ear: No  3. Scapula: No  4. Spinous Process: No  5. Shoulder: No  6. Elbow: No  7. Iliac Crest: No  8. Sacrum/Coccyx: No  9. Ischial Tuberosity: No  10. Trochanter: No  11. Knee: No  12. Malleolus: No  13. Heel: No  14. Toes: No  15. Assessed for device associated injury Yes  16. Nursing Nutrition Assessment Completed Yes

## 2017-07-27 NOTE — Progress Notes
Patient arrived on unit via cart accompanied by transport. Patient transferred to the bed with assistance. Assessment completed, refer to flowsheet for details. Orders released, reviewed, and implemented as appropriate. Oriented to surroundings, call light within reach. Plan of care reviewed.  Will continue to monitor and assess.

## 2017-07-28 ENCOUNTER — Encounter: Admit: 2017-07-28 | Discharge: 2017-07-28

## 2017-07-28 DIAGNOSIS — E785 Hyperlipidemia, unspecified: ICD-10-CM

## 2017-07-28 DIAGNOSIS — I499 Cardiac arrhythmia, unspecified: ICD-10-CM

## 2017-07-28 DIAGNOSIS — N2889 Other specified disorders of kidney and ureter: ICD-10-CM

## 2017-07-28 DIAGNOSIS — J438 Other emphysema: ICD-10-CM

## 2017-07-28 DIAGNOSIS — I10 Essential (primary) hypertension: Principal | ICD-10-CM

## 2017-07-28 DIAGNOSIS — I519 Heart disease, unspecified: ICD-10-CM

## 2017-07-28 DIAGNOSIS — K929 Disease of digestive system, unspecified: ICD-10-CM

## 2017-07-28 DIAGNOSIS — R972 Elevated prostate specific antigen [PSA]: ICD-10-CM

## 2017-07-28 DIAGNOSIS — N189 Chronic kidney disease, unspecified: ICD-10-CM

## 2017-07-28 DIAGNOSIS — I639 Cerebral infarction, unspecified: ICD-10-CM

## 2017-07-28 DIAGNOSIS — I2510 Atherosclerotic heart disease of native coronary artery without angina pectoris: ICD-10-CM

## 2017-07-28 DIAGNOSIS — D751 Secondary polycythemia: ICD-10-CM

## 2017-07-28 LAB — BASIC METABOLIC PANEL
Lab: 133 MMOL/L — ABNORMAL LOW (ref 137–147)
Lab: 4.7 MMOL/L — ABNORMAL LOW (ref 3.5–5.1)
Lab: 131 MMOL/L — ABNORMAL LOW (ref 137–147)

## 2017-07-28 MED ORDER — FAMOTIDINE 20 MG PO TAB
20 mg | Freq: Every day | ORAL | 0 refills | Status: DC
Start: 2017-07-28 — End: 2017-07-29
  Administered 2017-07-29: 14:00:00 20 mg via ORAL

## 2017-07-28 MED ORDER — BISACODYL 10 MG RE SUPP
10 mg | Freq: Two times a day (BID) | RECTAL | 0 refills | Status: DC
Start: 2017-07-28 — End: 2017-07-29
  Administered 2017-07-29 (×2): 10 mg via RECTAL

## 2017-07-29 ENCOUNTER — Encounter: Admit: 2017-07-29 | Discharge: 2017-07-29

## 2017-07-29 ENCOUNTER — Observation Stay: Admit: 2017-07-26 | Discharge: 2017-07-29 | Payer: MEDICARE

## 2017-07-29 DIAGNOSIS — Z8673 Personal history of transient ischemic attack (TIA), and cerebral infarction without residual deficits: ICD-10-CM

## 2017-07-29 DIAGNOSIS — I129 Hypertensive chronic kidney disease with stage 1 through stage 4 chronic kidney disease, or unspecified chronic kidney disease: ICD-10-CM

## 2017-07-29 DIAGNOSIS — Z951 Presence of aortocoronary bypass graft: ICD-10-CM

## 2017-07-29 DIAGNOSIS — D45 Polycythemia vera: ICD-10-CM

## 2017-07-29 DIAGNOSIS — D3001 Benign neoplasm of right kidney: ICD-10-CM

## 2017-07-29 DIAGNOSIS — C641 Malignant neoplasm of right kidney, except renal pelvis: Principal | ICD-10-CM

## 2017-07-29 DIAGNOSIS — I2510 Atherosclerotic heart disease of native coronary artery without angina pectoris: ICD-10-CM

## 2017-07-29 DIAGNOSIS — N183 Chronic kidney disease, stage 3 (moderate): ICD-10-CM

## 2017-07-29 DIAGNOSIS — Z7982 Long term (current) use of aspirin: ICD-10-CM

## 2017-07-29 DIAGNOSIS — J449 Chronic obstructive pulmonary disease, unspecified: ICD-10-CM

## 2017-07-29 DIAGNOSIS — Z87891 Personal history of nicotine dependence: ICD-10-CM

## 2017-07-29 DIAGNOSIS — N179 Acute kidney failure, unspecified: ICD-10-CM

## 2017-07-29 DIAGNOSIS — E785 Hyperlipidemia, unspecified: ICD-10-CM

## 2017-07-29 LAB — BASIC METABOLIC PANEL
Lab: 131 MMOL/L — ABNORMAL LOW (ref <150)
Lab: 4.6 MMOL/L — ABNORMAL LOW (ref >40)

## 2017-07-29 MED ORDER — FUROSEMIDE 20 MG PO TAB
20 mg | ORAL_TABLET | Freq: Every morning | ORAL | 3 refills | 90.00000 days | Status: AC
Start: 2017-07-29 — End: 2017-12-12

## 2017-07-29 MED ORDER — POLYETHYLENE GLYCOL 3350 17 GRAM/DOSE PO POWD
17 g | Freq: Every day | ORAL | 3 refills | 30.00000 days | Status: AC
Start: 2017-07-29 — End: ?

## 2017-07-29 MED ORDER — OXYCODONE 5 MG PO TAB
5-10 mg | ORAL_TABLET | ORAL | 0 refills | 6.00000 days | Status: AC | PRN
Start: 2017-07-29 — End: 2017-11-14
  Filled 2017-07-29 (×2): qty 15, 2d supply, fill #1

## 2017-07-29 NOTE — Progress Notes
Chris Lambert discharged on 07/29/2017.   Marland Kitchen  Discharge instructions reviewed with patient and wife.  Valuables returned: patient in possession of all personal belongings at d/c  Personal Items / Valuables: Clothing, Eyeglasses/Contacts, Dentures  Denture Type: Full upper, Full lower  Where Are Valuables Stored?: all belongings given to wife.  Home medications: n/a    Patient transported to lobby via wheelchair by inhospital transport.

## 2017-07-29 NOTE — Progress Notes
RT Adult Assessment Note    NAME:Chris Lambert             MRN: [id number]             DOB:1946/05/26          AGE: 71 y.o.  ADMISSION DATE: 07/26/2017             DAYS ADMITTED: LOS: 0 days    RT Treatment Plan:  Protocol Plan: Medications  Albuterol/Ipratropium: Neb PRN    Protocol Plan: Procedures  PEP Therapy: Place a nursing order for "IS Q1h While Awake" for any of Lung Expansion indicators(pt. instruct)  Oxygen/Humidity: Discontinued  Monitoring: Discontinued    Additional Comments:  Impressions of the patient: pt. sitting at side of bed; no respiratory needs at this time  Intervention(s)/outcome(s): see above; pt. to discharge from hostpital in next few hours  Patient education that was completed: pt. encouraged to practice IS and PEP device   Recommendations to the care team: none    Vital Signs:  Pulse:    RR:    SpO2:    O2 Device:    Liter Flow:    O2%: O2 Percent: 21 %  Breath Sounds:    Respiratory Effort:

## 2017-07-29 NOTE — Progress Notes
RT Adult Assessment Note    NAME:Chris Lambert             MRN: [id number]             DOB:April 30, 1946          AGE: 71 y.o.  ADMISSION DATE: 07/26/2017             DAYS ADMITTED: LOS: 0 days    RT Treatment Plan:  Protocol Plan: Medications  Albuterol/Ipratropium: Neb PRN(home regimen)    Protocol Plan: Procedures  PEP Therapy: Q4h PEP While Awake  PAP: Place a nursing order for "IS Q1h While Awake" for any of Lung Expansion indicators  Oxygen/Humidity: O2 to keep SpO2 > 92%  Monitoring: Pulse oximetry Q6h & PRN  Comment: Advair BID    Additional Comments:  Impressions of the patient: alert and awake  Intervention(s)/outcome(s): Gave advair  Recommendations to the care team: continue with RT protocol      Vital Signs:  Pulse: Pulse: 70  RR: Respirations: 18 PER MINUTE  SpO2: SpO2: 93 %  O2 Device:    Liter Flow:    O2%: O2 Percent: 21 %  Breath Sounds:    Respiratory Effort: Respiratory Effort: Non-Labored

## 2017-08-01 NOTE — Discharge Instructions - Pharmacy
Physician Discharge Summary      Name: Chris Lambert  Medical Record Number: [id number]        Account Number:  [id number]  Date Of Birth:  11-03-1945                         Age:  71 years   Admit date:  07/26/2017                     Discharge date:  07/29/2017    Attending Physician:  Earl Many, MD                    Service: Surgery-Urology    Physician Summary completed by: Terrilyn Saver, APRN-NP    Reason for hospitalization: Surgical procedure for right renal mass    Significant PMH:   Past Medical History:   Diagnosis Date   ??? Arrhythmia     A fibrillation briefly s/p CABG   ??? CKD (chronic kidney disease)    ??? Coronary artery disease    ??? Elevated PSA    ??? Gastrointestinal disorder    ??? Heart disease    ??? Hyperlipidemia    ??? Hypertension    ??? Other emphysema (HCC)    ??? Polycythemia    ??? Renal mass    ??? Stroke [facility])           Allergies: Patient has no known allergies.    Admission Physical Exam notable for:  Right renal mass      Admission Lab/Radiology studies notable for: Right renal mass    Brief Hospital Course:  The patient was admitted and the following issues were addressed during this hospitalization: (with pertinent details).  Patient was admitted for the surgery listed below. Patient tolerated the procedure well but creatinine increased to 2.58. He was given a bolus and monitored until it began trending down on POD 3.   Pain was controlled, tolerated a diet, and all discharge criteria were met.     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  Surgical Procedures: Right robotic-assisted laparoscopic radical nephrectomy,  Intraoperative use of ultrasound    Significant Diagnostic Studies and Procedures: Pathology  A. Kidney, right kidney lesion, biopsy: Papillary adenoma, 6 mm. ???     B. Right nephric cyst wall, excision:   Benign cyst.     C. Kidney, right kidney, nephrectomy:   Papillary renal cell carcinoma, multifocal, WHO/ISUP grade 3. See   checklist.   Papillary adenomas, multiple. ???   Consults:  None    Patient Disposition: Home       Patient instructions/medications:      Basic Metabolic Panel (BMP)   Standing Status: Future Standing Exp. Date: 07/29/18     Driving Restrictions    No driving while taking pain medication.     Strenuous Activity Restrictions    Please refrain from strenuous activity for 6 week(s). No lifting more than 10 pounds for 6 weeks.     Report These Signs and Symptoms    Please notify physician if experiencing any chest pain, shortness of breath, calf tenderness or  unilateral leg swelling, uncontrolled pain, incision redness or foul smelling drainage from wound, fevers >101.5, or any other worsening signs/symptoms.     Questions About Your Stay    For questions or concerns regarding your hospital stay. Call [phone]  You may have questions about your hospital stay after you get home.      From 8 AM to 4 PM Monday through Friday, please call [phone].   If calling after hours or with urgent questions, please call [phone] and ask for the urology resident on call.  In case of an emergency, please report to your nearest emergency department and contact us on the way.     Discharging attending physician: Perlie Gold, DAVID [phone]      Regular Diet    You have no dietary restriction. Please continue with a healthy balanced diet.     Incision Care    *Keep your incision clean and dry.  *Shower daily. Wash your incisions with soap and water.   *Do not submerge incision in tub, pool, hot tub, or lake for 4 weeks.  *Usually there are not stitches to be removed. Steri-strips (strips of tape) will begin to fall off in 10-14 days. If they remain after 2 weeks, gently remove them when they are damp after a shower. *Your incision should gradually look better each day. If you notice unusual swelling, redness, drainage, have increasing pain at the site, or have a fever greater than 100 degrees, notify your physician immediately.     Return Appointment    You will need to go to the [facility] at 8:00 to have your labs drawn on the ground floor of the Medical Office Building. You will see Dr. Perlie Gold at 9:15 in the Urology clinic on the second floor of the Medical Office Building.     [facility] Provider [place], DAVID [phone]    Location  Urology Clinic    Appointment date: 08/15/2017    Appointment time: 9:15 AM      Opioid (Narcotic) Safety Information    OPIOID (NARCOTIC) PAIN MEDICATION SAFETY    We care about your comfort, and believe you need opioid medications at this time to treat your pain.  An opioid is a strong pain medication.  It is only available by prescription for moderate to severe pain.  Usually these medications are used for only a short time to treat pain, but sometimes will be prescribed for longer.  Talk with your doctor or nurse about how long they expect you to need this medication.    When used the right way, opioids are safe and effective medications to treat your pain, even when used for a long time.  Yet, when used in the wrong way, opioids can be dangerous for you or others.  Opioids do not work for everyone.  Most patients do not get full relief of their pain from opioid medication; full relief of your pain may not be possible.     For your safety, we ask you to follow these instructions:    *Only take your opioid medication as prescribed.  If your pain is not controlled with the prescribed dose, or the medication is not lasting long enough, call your doctor.  *Do not break or crush your opioid medication unless your doctor or pharmacist says you can.  With certain medications, this can be dangerous, and may cause death.  *Never share your medications with others, even if they appear to have a good reason.  Never take  someone else's pain medication-this is dangerous, and illegal (a crime).  Overdoses and deaths have occurred.  *Keep your opioid medications safe, as you would with cash, in a lock box or similar container.  *Make sure your opioids are going to be secure, especially if you are around children or teens.  *Talk with your doctor or pharmacist before you take other medications.  *Avoid driving, operating machinery, or drinking alcohol while taking opioid pain medication.  This may be unsafe.    Pain medications can cause constipation. Constipation is bowel movements that are less often than normal. Stools often become very hard and difficult to pass. This may lead to stomach pain and bloating. It may also cause pain when trying to use the bathroom. Constipation may be treated with suppositories, laxatives or stool softeners. A diet high in fiber with plenty of fluids helps to maintain regular, soft bowel movements.      Current Discharge Medication List       START taking these medications    Details   oxyCODONE (ROXICODONE, OXY-IR) 5 mg tablet Take one tablet to two tablets by mouth every 6 hours as needed Earliest Fill Date: 07/29/17  Qty: 15 tablet, Refills: 0    PRESCRIPTION TYPE:  Print      polyethylene glycol 3350 (GLYCOLAX; MIRALAX) 17 gram/dose powder Take seventeen g by mouth daily.  Qty: 527 g, Refills: 3    PRESCRIPTION TYPE:  Normal          CONTINUE these medications which have been CHANGED or REFILLED    Details   furosemide (LASIX) 20 mg tablet Take one tablet by mouth every morning. Hold and follow up with prescribing provider on restarting  Qty: 90 tablet, Refills: 3    PRESCRIPTION TYPE:  No Print          CONTINUE these medications which have NOT CHANGED    Details   acetaminophen (TYLENOL) 500 mg tablet Take 1,000 mg by mouth every 6 hours as needed for Pain. Max of 4,000 mg of acetaminophen in 24 hours.    PRESCRIPTION TYPE:  Historical Med ADVAIR HFA 230-21 mcg/actuation inhaler INHALE 2 PUFFS TWICE DAILY RINSE MOUTH AND THROAT AFTER USE  Refills: 0    PRESCRIPTION TYPE:  Historical Med      amLODIPine (NORVASC) 10 mg tablet Take 10 mg by mouth daily with breakfast.    PRESCRIPTION TYPE:  Historical Med      aspirin EC 81 mg tablet Take 81 mg by mouth daily with breakfast. Take with food.    PRESCRIPTION TYPE:  Historical Med      atenolol (TENORMIN) 100 mg tablet Take 100 mg by mouth daily with breakfast.    PRESCRIPTION TYPE:  Historical Med      atorvastatin (LIPITOR) 20 mg tablet Take 20 mg by mouth at bedtime daily.    PRESCRIPTION TYPE:  Historical Med      calcium carbonate (TUMS) 500 mg (200 mg elemental calcium) chewable tablet Chew 500-1,000 mg by mouth daily as needed.    PRESCRIPTION TYPE:  Historical Med      docusate (COLACE) 100 mg capsule Take 100 mg by mouth daily with breakfast.    PRESCRIPTION TYPE:  Historical Med      finasteride (PROSCAR) 5 mg tablet Take 5 mg by mouth daily with breakfast.    PRESCRIPTION TYPE:  Historical Med      hydroxyurea (HYDREA) 500 mg capsule Take 500 mg by mouth daily with breakfast. Take at  the same time every day.     PRESCRIPTION TYPE:  Historical Med      naphazoline HCl (CLEAR EYES OP) Apply 1-2 drops to both eyes daily as needed.    PRESCRIPTION TYPE:  Historical Med      tamsulosin (FLOMAX) 0.4 mg capsule Take 0.4 mg by mouth daily with dinner.    PRESCRIPTION TYPE:  Historical Med      traMADol (ULTRAM) 50 mg tablet Take 50 mg by mouth every 6 hours as needed.    PRESCRIPTION TYPE:  Historical Med      vitamins, multiple cap Take 1 capsule by mouth daily.    PRESCRIPTION TYPE:  Historical Med          The following medications were removed from your list. This list includes medications discontinued this stay and those removed from your prior med list in our system        losartan (COZAAR) 25 mg tablet        ranitidine(+) (ZANTAC) 150 mg tablet               Scheduled appointments: Aug 15, 2017  9:15 AM CST  Post - Op with Earl Many, MD  [facility]) [facility] Level 2A  [address]  [place] [address]  [phone]          Pending items needing follow up: Pathology results, follow up in 3 weeks with a BMP prior    Signed:  Terrilyn Saver, APRN-NP  08/01/2017      cc:  Primary Care Physician:  Steva Ready   Verified  Referring physicians:  Benito Mccreedy, MD   Additional provider(s):

## 2017-08-15 ENCOUNTER — Ambulatory Visit: Admit: 2017-08-15 | Discharge: 2017-08-15 | Payer: MEDICARE

## 2017-08-15 ENCOUNTER — Encounter: Admit: 2017-08-15 | Discharge: 2017-08-15

## 2017-08-15 DIAGNOSIS — I639 Cerebral infarction, unspecified: ICD-10-CM

## 2017-08-15 DIAGNOSIS — I10 Essential (primary) hypertension: Principal | ICD-10-CM

## 2017-08-15 DIAGNOSIS — I499 Cardiac arrhythmia, unspecified: ICD-10-CM

## 2017-08-15 DIAGNOSIS — D751 Secondary polycythemia: ICD-10-CM

## 2017-08-15 DIAGNOSIS — K929 Disease of digestive system, unspecified: ICD-10-CM

## 2017-08-15 DIAGNOSIS — I2510 Atherosclerotic heart disease of native coronary artery without angina pectoris: ICD-10-CM

## 2017-08-15 DIAGNOSIS — C649 Malignant neoplasm of unspecified kidney, except renal pelvis: Principal | ICD-10-CM

## 2017-08-15 DIAGNOSIS — I519 Heart disease, unspecified: ICD-10-CM

## 2017-08-15 DIAGNOSIS — J438 Other emphysema: ICD-10-CM

## 2017-08-15 DIAGNOSIS — N2889 Other specified disorders of kidney and ureter: Principal | ICD-10-CM

## 2017-08-15 DIAGNOSIS — R972 Elevated prostate specific antigen [PSA]: ICD-10-CM

## 2017-08-15 DIAGNOSIS — N189 Chronic kidney disease, unspecified: ICD-10-CM

## 2017-08-15 DIAGNOSIS — E785 Hyperlipidemia, unspecified: ICD-10-CM

## 2017-08-15 LAB — BASIC METABOLIC PANEL
Lab: 10 mg/dL — ABNORMAL HIGH (ref >50)
Lab: 105 MMOL/L (ref 98–110)
Lab: 137 MMOL/L (ref 137–147)
Lab: 2.9 mg/dL — ABNORMAL HIGH (ref <200)
Lab: 21 mL/min — ABNORMAL LOW (ref >60)
Lab: 24 MMOL/L (ref 21–30)
Lab: 26 mL/min — ABNORMAL LOW (ref >60)
Lab: 4.9 MMOL/L (ref 3.5–5.1)
Lab: 79 mg/dL (ref 70–100)
Lab: 8 fL (ref 3–12)

## 2017-08-15 NOTE — Progress Notes
Date of Service: 08/15/2017     Subjective:             Chris Lambert is a 71 y.o. male who presents for post-op visit after a radical nephrectomy.    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 Since the procedure he has been doing well. Denies fevers, chills, sweats, uncontrolled pain, or evidence of surgical site infection.  He denies any new symptoms or concerns and reports that he is doing very well. He does have a history of CKD and had significantly elevated Cr. values since his surgery.       Past Medical History:   Diagnosis Date   ??? Arrhythmia     A fibrillation briefly s/p CABG   ??? CKD (chronic kidney disease)    ??? Coronary artery disease    ??? Elevated PSA    ??? Gastrointestinal disorder    ??? Heart disease    ??? Hyperlipidemia    ??? Hypertension    ??? Other emphysema (HCC)    ??? Polycythemia    ??? Renal mass    ??? Stroke [facility])        Past Surgical History:   Procedure Laterality Date   ??? HX CORONARY ARTERY BYPASS GRAFT     ??? MOUTH SURGERY         Family History   Problem Relation Age of Onset   ??? Cancer Mother    ??? Hypertension Mother    ??? Diabetes Sister    ??? Diabetes Maternal Grandmother    ??? Hypertension Maternal Grandmother    ??? Heart Disease Other         Mat. Uncles    ??? Hypertension Other        Current Outpatient Medications   Medication Sig Dispense Refill   ??? acetaminophen (TYLENOL) 500 mg tablet Take 1,000 mg by mouth every 6 hours as needed for Pain. Max of 4,000 mg of acetaminophen in 24 hours.     ??? ADVAIR HFA 230-21 mcg/actuation inhaler INHALE 2 PUFFS TWICE DAILY RINSE MOUTH AND THROAT AFTER USE  0   ??? amLODIPine (NORVASC) 10 mg tablet Take 10 mg by mouth daily with breakfast.     ??? aspirin EC 81 mg tablet Take 81 mg by mouth daily with breakfast. Take with food.     ??? atenolol (TENORMIN) 100 mg tablet Take 100 mg by mouth daily with breakfast. ??? atorvastatin (LIPITOR) 20 mg tablet Take 20 mg by mouth at bedtime daily.     ??? calcium carbonate (TUMS) 500 mg (200 mg elemental calcium) chewable tablet Chew 500-1,000 mg by mouth daily as needed.     ??? docusate (COLACE) 100 mg capsule Take 100 mg by mouth daily with breakfast.     ??? finasteride (PROSCAR) 5 mg tablet Take 5 mg by mouth daily with breakfast.     ??? furosemide (LASIX) 20 mg tablet Take one tablet by mouth every morning. Hold and follow up with prescribing provider on restarting 90 tablet 3   ??? hydroxyurea (HYDREA) 500 mg capsule Take 500 mg by mouth daily with breakfast. Take at the same time every day.      ??? naphazoline HCl (CLEAR EYES OP) Apply 1-2 drops to both eyes daily as needed.     ??? oxyCODONE (ROXICODONE, OXY-IR) 5 mg tablet Take one tablet to two tablets by mouth every 6  hours as needed Earliest Fill Date: 07/29/17 15 tablet 0   ??? polyethylene glycol 3350 (GLYCOLAX; MIRALAX) 17 gram/dose powder Take seventeen g by mouth daily. 527 g 3   ??? tamsulosin (FLOMAX) 0.4 mg capsule Take 0.4 mg by mouth daily with dinner.     ??? traMADol (ULTRAM) 50 mg tablet Take 50 mg by mouth every 6 hours as needed.     ??? vitamins, multiple cap Take 1 capsule by mouth daily.       No current facility-administered medications for this visit.        No Known Allergies    Social History     Socioeconomic History   ??? Marital status: Married     Spouse name: Not on file   ??? Number of children: Not on file   ??? Years of education: Not on file   ??? Highest education level: Not on file   Social Needs   ??? Financial resource strain: Not on file   ??? Food insecurity - worry: Not on file   ??? Food insecurity - inability: Not on file   ??? Transportation needs - medical: Not on file   ??? Transportation needs - non-medical: Not on file   Occupational History   ??? Not on file   Tobacco Use   ??? Smoking status: Former Smoker     Last attempt to quit: 2012     Years since quitting: 6.9   ??? Smokeless tobacco: Never Used Substance and Sexual Activity   ??? Alcohol use: No     Comment: Quit in 1986    ??? Drug use: No   ??? Sexual activity: Not Currently     Partners: Female   Other Topics Concern   ??? Not on file   Social History Narrative   ??? Not on file         Review of Systems   Constitutional: Negative for activity change, appetite change, chills, diaphoresis, fever and unexpected weight change.   HENT: Negative for congestion, hearing loss and sinus pressure.    Eyes: Negative for visual disturbance.   Respiratory: Negative for apnea, cough, chest tightness and shortness of breath.    Cardiovascular: Negative for chest pain, palpitations and leg swelling.   Gastrointestinal: Negative for abdominal pain, blood in stool, constipation, nausea, rectal pain and vomiting.   Genitourinary: Negative for decreased urine volume, difficulty urinating, discharge, dysuria, enuresis, flank pain, frequency, hematuria, penile pain, penile swelling, scrotal swelling, testicular pain and urgency.   Musculoskeletal: Negative for arthralgias, back pain, gait problem and myalgias.   Skin: Negative for rash and wound.   Neurological: Negative for dizziness, tremors, seizures, syncope, weakness, light-headedness, numbness and headaches.   Hematological: Negative for adenopathy. Does not bruise/bleed easily.   Psychiatric/Behavioral: Negative for decreased concentration and dysphoric mood. The patient is not nervous/anxious.        Objective:         ??? acetaminophen (TYLENOL) 500 mg tablet Take 1,000 mg by mouth every 6 hours as needed for Pain. Max of 4,000 mg of acetaminophen in 24 hours.   ??? ADVAIR HFA 230-21 mcg/actuation inhaler INHALE 2 PUFFS TWICE DAILY RINSE MOUTH AND THROAT AFTER USE   ??? amLODIPine (NORVASC) 10 mg tablet Take 10 mg by mouth daily with breakfast.   ??? aspirin EC 81 mg tablet Take 81 mg by mouth daily with breakfast. Take with food.   ??? atenolol (TENORMIN) 100 mg tablet Take 100 mg by mouth daily with breakfast. ???  atorvastatin (LIPITOR) 20 mg tablet Take 20 mg by mouth at bedtime daily.   ??? calcium carbonate (TUMS) 500 mg (200 mg elemental calcium) chewable tablet Chew 500-1,000 mg by mouth daily as needed.   ??? docusate (COLACE) 100 mg capsule Take 100 mg by mouth daily with breakfast.   ??? finasteride (PROSCAR) 5 mg tablet Take 5 mg by mouth daily with breakfast.   ??? furosemide (LASIX) 20 mg tablet Take one tablet by mouth every morning. Hold and follow up with prescribing provider on restarting   ??? hydroxyurea (HYDREA) 500 mg capsule Take 500 mg by mouth daily with breakfast. Take at the same time every day.    ??? naphazoline HCl (CLEAR EYES OP) Apply 1-2 drops to both eyes daily as needed.   ??? oxyCODONE (ROXICODONE, OXY-IR) 5 mg tablet Take one tablet to two tablets by mouth every 6 hours as needed Earliest Fill Date: 07/29/17   ??? polyethylene glycol 3350 (GLYCOLAX; MIRALAX) 17 gram/dose powder Take seventeen g by mouth daily.   ??? tamsulosin (FLOMAX) 0.4 mg capsule Take 0.4 mg by mouth daily with dinner.   ??? traMADol (ULTRAM) 50 mg tablet Take 50 mg by mouth every 6 hours as needed.   ??? vitamins, multiple cap Take 1 capsule by mouth daily.     Vitals:    08/15/17 0925   BP: 143/76   Pulse: 64   Weight: 94.7 kg (208 lb 12.8 oz)   Height: 177.8 cm (70)     Body mass index is 29.96 kg/m???.       Physical Exam   Constitutional: He is oriented to person, place, and time. He appears well-developed and well-nourished.   HENT:   Head: Normocephalic and atraumatic.   Eyes: EOM are normal. Pupils are equal, round, and reactive to light.   Neck: Normal range of motion. No tracheal deviation present.   Cardiovascular: Normal rate.   Pulmonary/Chest: Effort normal. No stridor. No respiratory distress.   Abdominal:   Abdomen soft, appropriate.  Incisions healing well.   Neurological: He is alert and oriented to person, place, and time.   Skin: Skin is warm and dry.   Psychiatric: He has a normal mood and affect. His behavior is normal. Judgment normal.     Comprehensive Metabolic Profile    Lab Results   Component Value Date/Time    NA 137 08/15/2017 10:22 AM    K 4.9 08/15/2017 10:22 AM    CL 105 08/15/2017 10:22 AM    CO2 24 08/15/2017 10:22 AM    GAP 8 08/15/2017 10:22 AM    BUN 37 (H) 08/15/2017 10:22 AM    CR 2.93 (H) 08/15/2017 10:22 AM    GLU 79 08/15/2017 10:22 AM    Lab Results   Component Value Date/Time    CA 10.8 (H) 08/15/2017 10:22 AM    ALBUMIN 4.4 07/21/2017 09:00 AM    TOTPROT 7.5 07/21/2017 09:00 AM    ALKPHOS 73 07/21/2017 09:00 AM    AST 21 07/21/2017 09:00 AM    ALT 28 07/21/2017 09:00 AM    TOTBILI 0.6 07/21/2017 09:00 AM    GFR 21 (L) 08/15/2017 10:22 AM    GFRAA 26 (L) 08/15/2017 10:22 AM        Pathology (07/26/17)  Final Diagnosis:   A. Kidney, right kidney lesion, biopsy:   Papillary adenoma, 6 mm. ???   B. Right nephric cyst wall, excision:   Benign cyst.   C. Kidney, right kidney, nephrectomy:  Papillary renal cell carcinoma, multifocal, WHO/ISUP grade 3. See   checklist.   Papillary adenomas, multiple. ???     Assessment and Plan:    Problem   Right Renal Mass    - 03/28/2017  CT SCAN LUNG SCREENING - Findings: Exophytic cystic-like lesions or masses are present in the upper poles of the kidneys which were also present on the prior CT scan. There is a dominant lesion at the left upper renal pole measuring approximately 29 x 26 mm in dimension which is minimally changed in size from prior study though the attenuation is mildly increased with the Hounsfield unit attenuation documented at 44.  Impression:  Lung RADS category is L1. No suspicious pulmonary nodule or mass. Mild centrilobular emphysema.   - 05/30/2017  US RENAL - Large solid mass at the right lower renal pole. Renal cell carcinoma is the diagnosis of exclusion, though the Wilms tumor could result in a similar appearance. Multiple bilateral renal cysts. No hydronephrosis. There is extensive postvoid residual in the urinary bladder. Prostate gland shows mass effect on the posterior inferior urinary bladder wall.   - 06/03/2017  CT SCAN ABD/PELVIS - There is a dominant mass in the lower pole of the right kidney corresponding to the solid mass lesion seen on the prior ultrasound. This is likely to represent a renal cell carcinoma. There are numerous hyperdense, isodense and hypodense renal cortical cysts bilaterally. There is no hydronephrosis.  There is no evidence of significant lymph node enlargement. There is cholelithiasis with small stones seen within the contracted gallbladder. There are degenerative changes of the lumbar spine. There is a low density area within the medial segment of the left hepatic lobe which  is nonspecific in appearance. A liver mass cannot be entirely ruled out.         Right renal mass  69M s/p right radical nephrectomy doing well reporting no major concerns. 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     PLAN:  - Recommend nephrology evaluation   - BMP today  - F/U in 3 months with CXR, RBUS, labs    Patient seen and discussed with Dr. Perlie Gold, who directed plan of care.    Daniel A. Enid Skeens, MD  PGY1 Urology    Orders Placed This Encounter   ??? US RENAL BLADDER LTD   ??? CHEST 2 VIEWS   ??? COMPREHENSIVE METABOLIC PANEL     ATTESTATION    I personally performed the key portions of the E/M visit, discussed case with resident and concur with resident documentation of history, physical exam, assessment, and treatment plan unless otherwise noted.    Patient here for follow-up after right robotic radical nephrectomy for a [id number] papillary renal cell carcinoma which was multifocal in nature.  Patient also has concerning lesions on his left kidney.  Patient reports that he is recovering well.  He is having some constipation issues, but nothing out of the ordinary.  His creatinine is elevated.  We discussed the need for his to see a nephrologist.  We also discussed his pathology.  He will need close surveillance with an ultrasound, CXR, and labs in 3 months.  He may need a left partial versus radical nephrectomy depending on ultrasound findings.  All questions answered.  Copy of pathology given to patient.    Staff name:  Earl Many, MD Date:  08/16/2017

## 2017-08-16 NOTE — Assessment & Plan Note
78M s/p right radical nephrectomy doing well reporting no major concerns. 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     PLAN:  - Recommend nephrology evaluation   - BMP today  - F/U in 3 months with CXR, RBUS, labs

## 2017-11-14 ENCOUNTER — Ambulatory Visit: Admit: 2017-11-14 | Discharge: 2017-11-14 | Payer: MEDICARE

## 2017-11-14 ENCOUNTER — Ambulatory Visit: Admit: 2017-11-14 | Discharge: 2017-11-15

## 2017-11-14 ENCOUNTER — Encounter: Admit: 2017-11-14 | Discharge: 2017-11-14

## 2017-11-14 ENCOUNTER — Ambulatory Visit: Admit: 2017-11-14 | Discharge: 2017-11-14

## 2017-11-14 DIAGNOSIS — I2510 Atherosclerotic heart disease of native coronary artery without angina pectoris: ICD-10-CM

## 2017-11-14 DIAGNOSIS — N189 Chronic kidney disease, unspecified: ICD-10-CM

## 2017-11-14 DIAGNOSIS — R972 Elevated prostate specific antigen [PSA]: ICD-10-CM

## 2017-11-14 DIAGNOSIS — C649 Malignant neoplasm of unspecified kidney, except renal pelvis: Principal | ICD-10-CM

## 2017-11-14 DIAGNOSIS — I10 Essential (primary) hypertension: Principal | ICD-10-CM

## 2017-11-14 DIAGNOSIS — D751 Secondary polycythemia: ICD-10-CM

## 2017-11-14 DIAGNOSIS — R7989 Other specified abnormal findings of blood chemistry: ICD-10-CM

## 2017-11-14 DIAGNOSIS — K929 Disease of digestive system, unspecified: ICD-10-CM

## 2017-11-14 DIAGNOSIS — N2889 Other specified disorders of kidney and ureter: ICD-10-CM

## 2017-11-14 DIAGNOSIS — I499 Cardiac arrhythmia, unspecified: ICD-10-CM

## 2017-11-14 DIAGNOSIS — I519 Heart disease, unspecified: ICD-10-CM

## 2017-11-14 DIAGNOSIS — E785 Hyperlipidemia, unspecified: ICD-10-CM

## 2017-11-14 DIAGNOSIS — I639 Cerebral infarction, unspecified: ICD-10-CM

## 2017-11-14 DIAGNOSIS — J438 Other emphysema: ICD-10-CM

## 2017-11-14 LAB — COMPREHENSIVE METABOLIC PANEL
Lab: 106 mg/dL — ABNORMAL HIGH (ref 70–100)
Lab: 108 MMOL/L (ref 98–110)
Lab: 137 MMOL/L (ref 137–147)
Lab: 17 U/L (ref 7–40)
Lab: 2.6 mg/dL — ABNORMAL HIGH (ref 0.4–1.24)
Lab: 30 mg/dL — ABNORMAL HIGH (ref 7–25)
Lab: 4.5 g/dL (ref 3.5–5.0)
Lab: 4.6 MMOL/L (ref 3.5–5.1)
Lab: 7.5 g/dL (ref 6.0–8.0)

## 2017-11-17 ENCOUNTER — Encounter: Admit: 2017-11-17 | Discharge: 2017-11-17

## 2017-11-24 ENCOUNTER — Encounter: Admit: 2017-11-24 | Discharge: 2017-11-24

## 2017-11-24 DIAGNOSIS — N189 Chronic kidney disease, unspecified: ICD-10-CM

## 2017-11-24 DIAGNOSIS — J438 Other emphysema: ICD-10-CM

## 2017-11-24 DIAGNOSIS — E785 Hyperlipidemia, unspecified: ICD-10-CM

## 2017-11-24 DIAGNOSIS — I639 Cerebral infarction, unspecified: ICD-10-CM

## 2017-11-24 DIAGNOSIS — N2889 Other specified disorders of kidney and ureter: ICD-10-CM

## 2017-11-24 DIAGNOSIS — I519 Heart disease, unspecified: ICD-10-CM

## 2017-11-24 DIAGNOSIS — I2510 Atherosclerotic heart disease of native coronary artery without angina pectoris: ICD-10-CM

## 2017-11-24 DIAGNOSIS — I499 Cardiac arrhythmia, unspecified: ICD-10-CM

## 2017-11-24 DIAGNOSIS — R972 Elevated prostate specific antigen [PSA]: ICD-10-CM

## 2017-11-24 DIAGNOSIS — I10 Essential (primary) hypertension: Principal | ICD-10-CM

## 2017-11-24 DIAGNOSIS — D751 Secondary polycythemia: ICD-10-CM

## 2017-11-24 DIAGNOSIS — K929 Disease of digestive system, unspecified: ICD-10-CM

## 2017-12-12 ENCOUNTER — Ambulatory Visit: Admit: 2017-12-12 | Discharge: 2017-12-12 | Payer: MEDICARE

## 2017-12-12 ENCOUNTER — Encounter: Admit: 2017-12-12 | Discharge: 2017-12-12

## 2017-12-12 DIAGNOSIS — D751 Secondary polycythemia: ICD-10-CM

## 2017-12-12 DIAGNOSIS — D649 Anemia, unspecified: ICD-10-CM

## 2017-12-12 DIAGNOSIS — K929 Disease of digestive system, unspecified: ICD-10-CM

## 2017-12-12 DIAGNOSIS — J438 Other emphysema: ICD-10-CM

## 2017-12-12 DIAGNOSIS — I10 Essential (primary) hypertension: ICD-10-CM

## 2017-12-12 DIAGNOSIS — I499 Cardiac arrhythmia, unspecified: ICD-10-CM

## 2017-12-12 DIAGNOSIS — I519 Heart disease, unspecified: ICD-10-CM

## 2017-12-12 DIAGNOSIS — N184 Chronic kidney disease, stage 4 (severe): Principal | ICD-10-CM

## 2017-12-12 DIAGNOSIS — I2510 Atherosclerotic heart disease of native coronary artery without angina pectoris: ICD-10-CM

## 2017-12-12 DIAGNOSIS — R972 Elevated prostate specific antigen [PSA]: ICD-10-CM

## 2017-12-12 DIAGNOSIS — E785 Hyperlipidemia, unspecified: ICD-10-CM

## 2017-12-12 DIAGNOSIS — N400 Enlarged prostate without lower urinary tract symptoms: ICD-10-CM

## 2017-12-12 DIAGNOSIS — N189 Chronic kidney disease, unspecified: ICD-10-CM

## 2017-12-12 DIAGNOSIS — D45 Polycythemia vera: ICD-10-CM

## 2017-12-12 DIAGNOSIS — N2889 Other specified disorders of kidney and ureter: ICD-10-CM

## 2017-12-12 DIAGNOSIS — I639 Cerebral infarction, unspecified: ICD-10-CM

## 2017-12-12 LAB — PHOSPHORUS: Lab: 2.8 mg/dL (ref 2.0–4.5)

## 2017-12-12 LAB — CBC AND DIFF
Lab: 12 10*3/uL — ABNORMAL HIGH (ref >60)
Lab: 45 % — ABNORMAL HIGH (ref 40–50)
Lab: 5 M/UL — ABNORMAL HIGH (ref 4.4–5.5)

## 2017-12-12 LAB — PARATHYROID HORMONE: Lab: 105 pg/mL — ABNORMAL HIGH (ref 10–65)

## 2017-12-12 LAB — IRON + BINDING CAPACITY + %SAT+ FERRITIN
Lab: 16 % — ABNORMAL LOW (ref 28–42)
Lab: 17 ng/mL — ABNORMAL LOW (ref 30–300)
Lab: 350 ug/dL — ABNORMAL HIGH (ref 270–380)
Lab: 56 ug/dL — ABNORMAL HIGH (ref 50–185)

## 2017-12-12 LAB — PROTEIN/CR RATIO,UR RAN
Lab: 164 mg/dL
Lab: 193 mg/dL
Lab: 1.2

## 2017-12-12 LAB — COMPREHENSIVE METABOLIC PANEL
Lab: 137 MMOL/L — ABNORMAL HIGH (ref 137–147)
Lab: 5.2 MMOL/L — ABNORMAL HIGH (ref >60)

## 2017-12-12 LAB — URIC ACID: Lab: 7.8 mg/dL (ref 4.0–8.0)

## 2017-12-13 LAB — 25-OH VITAMIN D (D2 + D3): Lab: 29 ng/mL — ABNORMAL LOW (ref 30–80)

## 2018-01-29 ENCOUNTER — Encounter: Admit: 2018-01-29 | Discharge: 2018-01-29

## 2018-01-29 DIAGNOSIS — N2889 Other specified disorders of kidney and ureter: ICD-10-CM

## 2018-01-29 DIAGNOSIS — K929 Disease of digestive system, unspecified: ICD-10-CM

## 2018-01-29 DIAGNOSIS — N189 Chronic kidney disease, unspecified: ICD-10-CM

## 2018-01-29 DIAGNOSIS — I499 Cardiac arrhythmia, unspecified: ICD-10-CM

## 2018-01-29 DIAGNOSIS — J438 Other emphysema: ICD-10-CM

## 2018-01-29 DIAGNOSIS — I639 Cerebral infarction, unspecified: ICD-10-CM

## 2018-01-29 DIAGNOSIS — I10 Essential (primary) hypertension: Principal | ICD-10-CM

## 2018-01-29 DIAGNOSIS — I519 Heart disease, unspecified: ICD-10-CM

## 2018-01-29 DIAGNOSIS — E785 Hyperlipidemia, unspecified: ICD-10-CM

## 2018-01-29 DIAGNOSIS — I2510 Atherosclerotic heart disease of native coronary artery without angina pectoris: ICD-10-CM

## 2018-01-29 DIAGNOSIS — D751 Secondary polycythemia: ICD-10-CM

## 2018-01-29 DIAGNOSIS — R972 Elevated prostate specific antigen [PSA]: ICD-10-CM

## 2018-03-20 ENCOUNTER — Ambulatory Visit: Admit: 2018-03-20 | Discharge: 2018-03-20

## 2018-03-20 ENCOUNTER — Encounter: Admit: 2018-03-20 | Discharge: 2018-03-20

## 2018-03-20 ENCOUNTER — Ambulatory Visit: Admit: 2018-03-20 | Discharge: 2018-03-20 | Payer: MEDICARE

## 2018-03-20 DIAGNOSIS — I499 Cardiac arrhythmia, unspecified: ICD-10-CM

## 2018-03-20 DIAGNOSIS — I10 Essential (primary) hypertension: ICD-10-CM

## 2018-03-20 DIAGNOSIS — D649 Anemia, unspecified: ICD-10-CM

## 2018-03-20 DIAGNOSIS — N400 Enlarged prostate without lower urinary tract symptoms: ICD-10-CM

## 2018-03-20 DIAGNOSIS — N189 Chronic kidney disease, unspecified: ICD-10-CM

## 2018-03-20 DIAGNOSIS — D751 Secondary polycythemia: ICD-10-CM

## 2018-03-20 DIAGNOSIS — N2889 Other specified disorders of kidney and ureter: ICD-10-CM

## 2018-03-20 DIAGNOSIS — I519 Heart disease, unspecified: ICD-10-CM

## 2018-03-20 DIAGNOSIS — I2510 Atherosclerotic heart disease of native coronary artery without angina pectoris: ICD-10-CM

## 2018-03-20 DIAGNOSIS — E785 Hyperlipidemia, unspecified: ICD-10-CM

## 2018-03-20 DIAGNOSIS — D45 Polycythemia vera: ICD-10-CM

## 2018-03-20 DIAGNOSIS — K929 Disease of digestive system, unspecified: ICD-10-CM

## 2018-03-20 DIAGNOSIS — J438 Other emphysema: ICD-10-CM

## 2018-03-20 DIAGNOSIS — R972 Elevated prostate specific antigen [PSA]: ICD-10-CM

## 2018-03-20 DIAGNOSIS — N184 Chronic kidney disease, stage 4 (severe): Principal | ICD-10-CM

## 2018-03-20 DIAGNOSIS — I639 Cerebral infarction, unspecified: ICD-10-CM

## 2018-03-20 LAB — COMPREHENSIVE METABOLIC PANEL
Lab: 136 MMOL/L — ABNORMAL LOW (ref 137–147)
Lab: 5.2 MMOL/L — ABNORMAL HIGH (ref 3.5–5.1)

## 2018-03-20 LAB — IRON + BINDING CAPACITY + %SAT+ FERRITIN
Lab: 20 % — ABNORMAL LOW (ref 28–42)
Lab: 25 ng/mL — ABNORMAL LOW (ref 30–300)
Lab: 364 ug/dL (ref 270–380)
Lab: 73 ug/dL — ABNORMAL HIGH (ref 50–185)

## 2018-03-20 LAB — CBC AND DIFF
Lab: 0.1 10*3/uL (ref 0–0.20)
Lab: 0.5 10*3/uL — ABNORMAL HIGH (ref 0–0.45)
Lab: 0.9 10*3/uL — ABNORMAL HIGH (ref 0–0.80)
Lab: 1 % — ABNORMAL LOW (ref >60)
Lab: 1.7 10*3/uL (ref 1.0–4.8)
Lab: 10 10*3/uL (ref 4.5–11.0)
Lab: 16 % — ABNORMAL LOW (ref 24–44)
Lab: 426 10*3/uL — ABNORMAL HIGH (ref 150–400)
Lab: 49 % — ABNORMAL HIGH (ref 40–50)
Lab: 5 % — ABNORMAL LOW (ref >60)
Lab: 5.7 M/UL — ABNORMAL HIGH (ref 4.4–5.5)
Lab: 69 % (ref 41–77)
Lab: 7.4 10*3/uL — ABNORMAL HIGH (ref 1.8–7.0)
Lab: 8.4 FL (ref 7–11)

## 2018-03-20 LAB — PARATHYROID HORMONE: Lab: 62 pg/mL (ref 10–65)

## 2018-03-20 LAB — PHOSPHORUS: Lab: 3.5 mg/dL (ref 2.0–4.5)

## 2018-03-20 LAB — URIC ACID: Lab: 7.6 mg/dL (ref 4.0–8.0)

## 2018-03-20 MED ORDER — CARVEDILOL 25 MG PO TAB
25 mg | ORAL_TABLET | Freq: Two times a day (BID) | ORAL | 3 refills | 90.00000 days | Status: AC
Start: 2018-03-20 — End: ?

## 2018-03-21 LAB — 25-OH VITAMIN D (D2 + D3): Lab: 36 ng/mL — ABNORMAL HIGH (ref 30–80)

## 2018-04-07 ENCOUNTER — Encounter: Admit: 2018-04-07 | Discharge: 2018-04-07

## 2018-04-07 DIAGNOSIS — C649 Malignant neoplasm of unspecified kidney, except renal pelvis: Principal | ICD-10-CM

## 2018-04-07 DIAGNOSIS — R972 Elevated prostate specific antigen [PSA]: ICD-10-CM

## 2018-05-15 ENCOUNTER — Encounter: Admit: 2018-05-15 | Discharge: 2018-05-15

## 2018-05-15 ENCOUNTER — Ambulatory Visit: Admit: 2018-05-15 | Discharge: 2018-05-16

## 2018-05-15 ENCOUNTER — Ambulatory Visit: Admit: 2018-05-15 | Discharge: 2018-05-15

## 2018-05-15 ENCOUNTER — Ambulatory Visit: Admit: 2018-05-15 | Discharge: 2018-05-15 | Payer: MEDICARE

## 2018-05-15 DIAGNOSIS — R351 Nocturia: ICD-10-CM

## 2018-05-15 DIAGNOSIS — I499 Cardiac arrhythmia, unspecified: ICD-10-CM

## 2018-05-15 DIAGNOSIS — C641 Malignant neoplasm of right kidney, except renal pelvis: ICD-10-CM

## 2018-05-15 DIAGNOSIS — N429 Disorder of prostate, unspecified: ICD-10-CM

## 2018-05-15 DIAGNOSIS — C649 Malignant neoplasm of unspecified kidney, except renal pelvis: Principal | ICD-10-CM

## 2018-05-15 DIAGNOSIS — K929 Disease of digestive system, unspecified: ICD-10-CM

## 2018-05-15 DIAGNOSIS — I10 Essential (primary) hypertension: Principal | ICD-10-CM

## 2018-05-15 DIAGNOSIS — R972 Elevated prostate specific antigen [PSA]: ICD-10-CM

## 2018-05-15 DIAGNOSIS — D751 Secondary polycythemia: ICD-10-CM

## 2018-05-15 DIAGNOSIS — Z125 Encounter for screening for malignant neoplasm of prostate: ICD-10-CM

## 2018-05-15 DIAGNOSIS — I639 Cerebral infarction, unspecified: ICD-10-CM

## 2018-05-15 DIAGNOSIS — R160 Hepatomegaly, not elsewhere classified: ICD-10-CM

## 2018-05-15 DIAGNOSIS — N401 Enlarged prostate with lower urinary tract symptoms: ICD-10-CM

## 2018-05-15 DIAGNOSIS — I519 Heart disease, unspecified: ICD-10-CM

## 2018-05-15 DIAGNOSIS — J438 Other emphysema: ICD-10-CM

## 2018-05-15 DIAGNOSIS — I2510 Atherosclerotic heart disease of native coronary artery without angina pectoris: ICD-10-CM

## 2018-05-15 DIAGNOSIS — E785 Hyperlipidemia, unspecified: ICD-10-CM

## 2018-05-15 DIAGNOSIS — N2889 Other specified disorders of kidney and ureter: ICD-10-CM

## 2018-05-15 DIAGNOSIS — N189 Chronic kidney disease, unspecified: ICD-10-CM

## 2018-05-15 LAB — COMPREHENSIVE METABOLIC PANEL
Lab: 135 MMOL/L — ABNORMAL LOW (ref 137–147)
Lab: 18 U/L (ref 7–40)
Lab: 2.5 mg/dL — ABNORMAL HIGH (ref 0.4–1.24)
Lab: 20 U/L (ref 7–56)
Lab: 22 MMOL/L (ref 21–30)
Lab: 25 mL/min — ABNORMAL LOW (ref >60)
Lab: 30 mL/min — ABNORMAL LOW (ref >60)
Lab: 4.3 g/dL (ref 3.5–5.0)
Lab: 5.3 MMOL/L — ABNORMAL HIGH (ref 3.5–5.1)
Lab: 6 (ref 3–12)
Lab: 7.6 g/dL (ref 6.0–8.0)
Lab: 96 U/L (ref 25–110)
Lab: 97 mg/dL (ref 70–100)

## 2018-05-15 LAB — PROSTATIC SPECIFIC ANTIGEN-PSA: Lab: 4.6 ng/mL (ref <6.01)

## 2018-05-16 ENCOUNTER — Encounter: Admit: 2018-05-16 | Discharge: 2018-05-16

## 2018-05-20 ENCOUNTER — Encounter: Admit: 2018-05-20 | Discharge: 2018-05-20

## 2018-05-20 DIAGNOSIS — N2889 Other specified disorders of kidney and ureter: ICD-10-CM

## 2018-05-20 DIAGNOSIS — N189 Chronic kidney disease, unspecified: ICD-10-CM

## 2018-05-20 DIAGNOSIS — D751 Secondary polycythemia: ICD-10-CM

## 2018-05-20 DIAGNOSIS — I639 Cerebral infarction, unspecified: ICD-10-CM

## 2018-05-20 DIAGNOSIS — J438 Other emphysema: ICD-10-CM

## 2018-05-20 DIAGNOSIS — I499 Cardiac arrhythmia, unspecified: ICD-10-CM

## 2018-05-20 DIAGNOSIS — I2510 Atherosclerotic heart disease of native coronary artery without angina pectoris: ICD-10-CM

## 2018-05-20 DIAGNOSIS — I10 Essential (primary) hypertension: Principal | ICD-10-CM

## 2018-05-20 DIAGNOSIS — K929 Disease of digestive system, unspecified: ICD-10-CM

## 2018-05-20 DIAGNOSIS — R972 Elevated prostate specific antigen [PSA]: ICD-10-CM

## 2018-05-20 DIAGNOSIS — I519 Heart disease, unspecified: ICD-10-CM

## 2018-05-20 DIAGNOSIS — E785 Hyperlipidemia, unspecified: ICD-10-CM

## 2018-05-28 ENCOUNTER — Ambulatory Visit: Admit: 2018-05-28 | Discharge: 2018-05-28 | Payer: MEDICARE

## 2018-05-28 DIAGNOSIS — R160 Hepatomegaly, not elsewhere classified: Principal | ICD-10-CM

## 2018-05-28 MED ORDER — GADOXETATE 0.25 MMOL/ML (181.43 MG/ML) IV SOLN
10 mL | Freq: Once | INTRAVENOUS | 0 refills | Status: CP
Start: 2018-05-28 — End: ?
  Administered 2018-05-28: 16:00:00 10 mL via INTRAVENOUS

## 2018-06-02 ENCOUNTER — Encounter: Admit: 2018-06-02 | Discharge: 2018-06-02

## 2018-06-02 DIAGNOSIS — R160 Hepatomegaly, not elsewhere classified: Principal | ICD-10-CM

## 2018-06-07 ENCOUNTER — Encounter: Admit: 2018-06-07 | Discharge: 2018-06-07

## 2018-06-07 DIAGNOSIS — R160 Hepatomegaly, not elsewhere classified: Principal | ICD-10-CM

## 2018-06-13 ENCOUNTER — Ambulatory Visit: Admit: 2018-06-13 | Discharge: 2018-06-13 | Payer: MEDICARE

## 2018-06-13 DIAGNOSIS — K769 Liver disease, unspecified: ICD-10-CM

## 2018-06-13 DIAGNOSIS — R160 Hepatomegaly, not elsewhere classified: Principal | ICD-10-CM

## 2018-06-13 DIAGNOSIS — Z85528 Personal history of other malignant neoplasm of kidney: ICD-10-CM

## 2018-06-13 MED ORDER — SODIUM CHLORIDE 0.9 % IJ SOLN
0 refills | Status: CP
Start: 2018-06-13 — End: ?
  Administered 2018-06-13: 15:00:00 50 mL via INTRAVENOUS

## 2018-06-13 MED ORDER — LORAZEPAM 2 MG/ML IJ SOLN
.5 mg | Freq: Once | INTRAVENOUS | 0 refills | Status: DC
Start: 2018-06-13 — End: 2018-06-13

## 2018-06-13 MED ORDER — MIDAZOLAM 1 MG/ML IJ SOLN
1 mg | Freq: Once | INTRAVENOUS | 0 refills | Status: DC
Start: 2018-06-13 — End: 2018-06-13

## 2018-06-13 MED ORDER — FENTANYL CITRATE (PF) 50 MCG/ML IJ SOLN
0 refills | Status: CP
Start: 2018-06-13 — End: ?
  Administered 2018-06-13: 15:00:00 50 ug via INTRAVENOUS

## 2018-06-13 MED ORDER — ONDANSETRON HCL (PF) 4 MG/2 ML IJ SOLN
4 mg | Freq: Once | INTRAVENOUS | 0 refills | Status: CP
Start: 2018-06-13 — End: ?
  Administered 2018-06-13: 16:00:00 4 mg via INTRAVENOUS

## 2018-06-13 MED ORDER — OXYCODONE 5 MG PO TAB
5 mg | Freq: Once | ORAL | 0 refills | Status: CP
Start: 2018-06-13 — End: ?
  Administered 2018-06-13: 16:00:00 5 mg via ORAL

## 2018-06-13 MED ORDER — FENTANYL CITRATE (PF) 50 MCG/ML IJ SOLN
25-50 ug | Freq: Once | INTRAVENOUS | 0 refills | Status: CP
Start: 2018-06-13 — End: ?
  Administered 2018-06-13: 15:00:00 50 ug via INTRAVENOUS

## 2018-06-16 ENCOUNTER — Encounter: Admit: 2018-06-16 | Discharge: 2018-06-16

## 2018-06-16 DIAGNOSIS — C801 Malignant (primary) neoplasm, unspecified: Principal | ICD-10-CM

## 2018-06-19 ENCOUNTER — Ambulatory Visit: Admit: 2018-06-19 | Discharge: 2018-06-19

## 2018-06-19 ENCOUNTER — Ambulatory Visit: Admit: 2018-06-19 | Discharge: 2018-06-19 | Payer: MEDICARE

## 2018-06-19 ENCOUNTER — Encounter: Admit: 2018-06-19 | Discharge: 2018-06-19

## 2018-06-19 DIAGNOSIS — N184 Chronic kidney disease, stage 4 (severe): ICD-10-CM

## 2018-06-19 DIAGNOSIS — D649 Anemia, unspecified: ICD-10-CM

## 2018-06-19 DIAGNOSIS — N2889 Other specified disorders of kidney and ureter: ICD-10-CM

## 2018-06-19 DIAGNOSIS — I499 Cardiac arrhythmia, unspecified: ICD-10-CM

## 2018-06-19 DIAGNOSIS — N189 Chronic kidney disease, unspecified: ICD-10-CM

## 2018-06-19 DIAGNOSIS — I2510 Atherosclerotic heart disease of native coronary artery without angina pectoris: ICD-10-CM

## 2018-06-19 DIAGNOSIS — I519 Heart disease, unspecified: ICD-10-CM

## 2018-06-19 DIAGNOSIS — I10 Essential (primary) hypertension: Principal | ICD-10-CM

## 2018-06-19 DIAGNOSIS — R972 Elevated prostate specific antigen [PSA]: ICD-10-CM

## 2018-06-19 DIAGNOSIS — E785 Hyperlipidemia, unspecified: ICD-10-CM

## 2018-06-19 DIAGNOSIS — R160 Hepatomegaly, not elsewhere classified: Principal | ICD-10-CM

## 2018-06-19 DIAGNOSIS — J438 Other emphysema: ICD-10-CM

## 2018-06-19 DIAGNOSIS — I639 Cerebral infarction, unspecified: ICD-10-CM

## 2018-06-19 DIAGNOSIS — D751 Secondary polycythemia: ICD-10-CM

## 2018-06-19 DIAGNOSIS — K929 Disease of digestive system, unspecified: ICD-10-CM

## 2018-06-19 LAB — CBC
Lab: 10 10*3/uL — ABNORMAL LOW (ref 4.5–11.0)
Lab: 15 % — ABNORMAL HIGH (ref >60)
Lab: 5.3 M/UL (ref 4.4–5.5)

## 2018-06-19 LAB — COMPREHENSIVE METABOLIC PANEL
Lab: 0.6 mg/dL (ref 0.3–1.2)
Lab: 10 mg/dL — ABNORMAL HIGH (ref 8.5–10.6)
Lab: 139 MMOL/L (ref 137–147)
Lab: 22 U/L (ref 7–40)
Lab: 23 U/L (ref 7–56)
Lab: 24 MMOL/L (ref 21–30)
Lab: 39 mg/dL — ABNORMAL HIGH (ref >60)
Lab: 4 g/dL (ref 3.5–5.0)
Lab: 5.3 MMOL/L — ABNORMAL HIGH (ref 3.5–5.1)
Lab: 7.5 g/dL (ref 6.0–8.0)
Lab: 9 (ref 3–12)
Lab: 92 mg/dL (ref 70–100)
Lab: 98 U/L (ref 25–110)

## 2018-06-19 LAB — PROTIME INR (PT): Lab: 1 MMOL/L (ref 0.8–1.2)

## 2018-06-26 ENCOUNTER — Encounter: Admit: 2018-06-26 | Discharge: 2018-06-26

## 2018-06-26 ENCOUNTER — Encounter: Admit: 2018-06-26 | Discharge: 2018-06-26 | Payer: MEDICARE

## 2018-06-26 DIAGNOSIS — I639 Cerebral infarction, unspecified: ICD-10-CM

## 2018-06-26 DIAGNOSIS — D45 Polycythemia vera: ICD-10-CM

## 2018-06-26 DIAGNOSIS — N189 Chronic kidney disease, unspecified: ICD-10-CM

## 2018-06-26 DIAGNOSIS — R970 Elevated carcinoembryonic antigen [CEA]: ICD-10-CM

## 2018-06-26 DIAGNOSIS — R160 Hepatomegaly, not elsewhere classified: ICD-10-CM

## 2018-06-26 DIAGNOSIS — R978 Other abnormal tumor markers: ICD-10-CM

## 2018-06-26 DIAGNOSIS — C221 Intrahepatic bile duct carcinoma: ICD-10-CM

## 2018-06-26 DIAGNOSIS — D751 Secondary polycythemia: ICD-10-CM

## 2018-06-26 DIAGNOSIS — I10 Essential (primary) hypertension: Principal | ICD-10-CM

## 2018-06-26 DIAGNOSIS — I129 Hypertensive chronic kidney disease with stage 1 through stage 4 chronic kidney disease, or unspecified chronic kidney disease: ICD-10-CM

## 2018-06-26 DIAGNOSIS — R972 Elevated prostate specific antigen [PSA]: ICD-10-CM

## 2018-06-26 DIAGNOSIS — M1990 Unspecified osteoarthritis, unspecified site: ICD-10-CM

## 2018-06-26 DIAGNOSIS — C801 Malignant (primary) neoplasm, unspecified: Principal | ICD-10-CM

## 2018-06-26 DIAGNOSIS — I2510 Atherosclerotic heart disease of native coronary artery without angina pectoris: ICD-10-CM

## 2018-06-26 DIAGNOSIS — C229 Malignant neoplasm of liver, not specified as primary or secondary: ICD-10-CM

## 2018-06-26 DIAGNOSIS — J438 Other emphysema: ICD-10-CM

## 2018-06-26 DIAGNOSIS — C787 Secondary malignant neoplasm of liver and intrahepatic bile duct: ICD-10-CM

## 2018-06-26 DIAGNOSIS — K319 Disease of stomach and duodenum, unspecified: ICD-10-CM

## 2018-06-26 DIAGNOSIS — M549 Dorsalgia, unspecified: ICD-10-CM

## 2018-06-26 DIAGNOSIS — N184 Chronic kidney disease, stage 4 (severe): ICD-10-CM

## 2018-06-26 DIAGNOSIS — E785 Hyperlipidemia, unspecified: ICD-10-CM

## 2018-06-26 DIAGNOSIS — C649 Malignant neoplasm of unspecified kidney, except renal pelvis: ICD-10-CM

## 2018-06-26 DIAGNOSIS — C641 Malignant neoplasm of right kidney, except renal pelvis: Principal | ICD-10-CM

## 2018-06-26 DIAGNOSIS — K929 Disease of digestive system, unspecified: ICD-10-CM

## 2018-06-26 DIAGNOSIS — I499 Cardiac arrhythmia, unspecified: ICD-10-CM

## 2018-06-26 DIAGNOSIS — N2889 Other specified disorders of kidney and ureter: ICD-10-CM

## 2018-06-26 DIAGNOSIS — H547 Unspecified visual loss: ICD-10-CM

## 2018-06-26 DIAGNOSIS — I519 Heart disease, unspecified: ICD-10-CM

## 2018-06-26 LAB — CA19.9: Lab: 23 U/mL (ref <35)

## 2018-06-26 LAB — CEA(CARCINOEMBRYONIC AG): Lab: 1 ng/mL (ref <3.0)

## 2018-06-27 ENCOUNTER — Encounter: Admit: 2018-06-27 | Discharge: 2018-06-27

## 2018-06-27 DIAGNOSIS — I639 Cerebral infarction, unspecified: ICD-10-CM

## 2018-06-27 DIAGNOSIS — J438 Other emphysema: ICD-10-CM

## 2018-06-27 DIAGNOSIS — N189 Chronic kidney disease, unspecified: ICD-10-CM

## 2018-06-27 DIAGNOSIS — R972 Elevated prostate specific antigen [PSA]: ICD-10-CM

## 2018-06-27 DIAGNOSIS — H547 Unspecified visual loss: ICD-10-CM

## 2018-06-27 DIAGNOSIS — D751 Secondary polycythemia: ICD-10-CM

## 2018-06-27 DIAGNOSIS — M549 Dorsalgia, unspecified: ICD-10-CM

## 2018-06-27 DIAGNOSIS — I519 Heart disease, unspecified: ICD-10-CM

## 2018-06-27 DIAGNOSIS — C649 Malignant neoplasm of unspecified kidney, except renal pelvis: ICD-10-CM

## 2018-06-27 DIAGNOSIS — K319 Disease of stomach and duodenum, unspecified: ICD-10-CM

## 2018-06-27 DIAGNOSIS — K929 Disease of digestive system, unspecified: ICD-10-CM

## 2018-06-27 DIAGNOSIS — I499 Cardiac arrhythmia, unspecified: ICD-10-CM

## 2018-06-27 DIAGNOSIS — I2510 Atherosclerotic heart disease of native coronary artery without angina pectoris: ICD-10-CM

## 2018-06-27 DIAGNOSIS — I10 Essential (primary) hypertension: Principal | ICD-10-CM

## 2018-06-27 DIAGNOSIS — E785 Hyperlipidemia, unspecified: ICD-10-CM

## 2018-06-27 DIAGNOSIS — N2889 Other specified disorders of kidney and ureter: ICD-10-CM

## 2018-06-27 DIAGNOSIS — M1990 Unspecified osteoarthritis, unspecified site: ICD-10-CM

## 2018-06-27 MED ORDER — PEG-ELECTROLYTE SOLN 420 GRAM PO SOLR
0 refills | Status: AC
Start: 2018-06-27 — End: 2018-09-25

## 2018-06-28 ENCOUNTER — Encounter: Admit: 2018-06-28 | Discharge: 2018-06-28

## 2018-07-06 ENCOUNTER — Encounter: Admit: 2018-07-06 | Discharge: 2018-07-06

## 2018-07-06 ENCOUNTER — Ambulatory Visit: Admit: 2018-07-06 | Discharge: 2018-07-06

## 2018-07-06 ENCOUNTER — Ambulatory Visit: Admit: 2018-07-06 | Discharge: 2018-07-06 | Payer: MEDICARE

## 2018-07-06 DIAGNOSIS — R972 Elevated prostate specific antigen [PSA]: ICD-10-CM

## 2018-07-06 DIAGNOSIS — J438 Other emphysema: ICD-10-CM

## 2018-07-06 DIAGNOSIS — N2889 Other specified disorders of kidney and ureter: ICD-10-CM

## 2018-07-06 DIAGNOSIS — M1990 Unspecified osteoarthritis, unspecified site: ICD-10-CM

## 2018-07-06 DIAGNOSIS — I10 Essential (primary) hypertension: Principal | ICD-10-CM

## 2018-07-06 DIAGNOSIS — C221 Intrahepatic bile duct carcinoma: ICD-10-CM

## 2018-07-06 DIAGNOSIS — K929 Disease of digestive system, unspecified: ICD-10-CM

## 2018-07-06 DIAGNOSIS — E785 Hyperlipidemia, unspecified: ICD-10-CM

## 2018-07-06 DIAGNOSIS — K319 Disease of stomach and duodenum, unspecified: ICD-10-CM

## 2018-07-06 DIAGNOSIS — R160 Hepatomegaly, not elsewhere classified: ICD-10-CM

## 2018-07-06 DIAGNOSIS — I499 Cardiac arrhythmia, unspecified: ICD-10-CM

## 2018-07-06 DIAGNOSIS — I639 Cerebral infarction, unspecified: ICD-10-CM

## 2018-07-06 DIAGNOSIS — N189 Chronic kidney disease, unspecified: ICD-10-CM

## 2018-07-06 DIAGNOSIS — C229 Malignant neoplasm of liver, not specified as primary or secondary: Principal | ICD-10-CM

## 2018-07-06 DIAGNOSIS — I519 Heart disease, unspecified: ICD-10-CM

## 2018-07-06 DIAGNOSIS — C649 Malignant neoplasm of unspecified kidney, except renal pelvis: ICD-10-CM

## 2018-07-06 DIAGNOSIS — D751 Secondary polycythemia: ICD-10-CM

## 2018-07-06 DIAGNOSIS — I2510 Atherosclerotic heart disease of native coronary artery without angina pectoris: ICD-10-CM

## 2018-07-06 DIAGNOSIS — H547 Unspecified visual loss: ICD-10-CM

## 2018-07-06 DIAGNOSIS — M549 Dorsalgia, unspecified: ICD-10-CM

## 2018-07-06 LAB — POC GLUCOSE: Lab: 125 mg/dL — ABNORMAL HIGH (ref 70–100)

## 2018-07-06 MED ORDER — RP DX F-18 FDG MCI
10 | Freq: Once | INTRAVENOUS | 0 refills | Status: CP
Start: 2018-07-06 — End: ?
  Administered 2018-07-06: 19:00:00 10.4 via INTRAVENOUS

## 2018-07-12 ENCOUNTER — Encounter: Admit: 2018-07-12 | Discharge: 2018-07-12

## 2018-07-12 ENCOUNTER — Encounter: Admit: 2018-07-12 | Discharge: 2018-07-12 | Payer: MEDICARE

## 2018-07-12 DIAGNOSIS — R978 Other abnormal tumor markers: ICD-10-CM

## 2018-07-12 DIAGNOSIS — D751 Secondary polycythemia: ICD-10-CM

## 2018-07-12 DIAGNOSIS — M549 Dorsalgia, unspecified: ICD-10-CM

## 2018-07-12 DIAGNOSIS — K319 Disease of stomach and duodenum, unspecified: ICD-10-CM

## 2018-07-12 DIAGNOSIS — H547 Unspecified visual loss: ICD-10-CM

## 2018-07-12 DIAGNOSIS — I519 Heart disease, unspecified: ICD-10-CM

## 2018-07-12 DIAGNOSIS — E785 Hyperlipidemia, unspecified: ICD-10-CM

## 2018-07-12 DIAGNOSIS — R972 Elevated prostate specific antigen [PSA]: ICD-10-CM

## 2018-07-12 DIAGNOSIS — K929 Disease of digestive system, unspecified: ICD-10-CM

## 2018-07-12 DIAGNOSIS — I10 Essential (primary) hypertension: Principal | ICD-10-CM

## 2018-07-12 DIAGNOSIS — I499 Cardiac arrhythmia, unspecified: ICD-10-CM

## 2018-07-12 DIAGNOSIS — N189 Chronic kidney disease, unspecified: ICD-10-CM

## 2018-07-12 DIAGNOSIS — I2510 Atherosclerotic heart disease of native coronary artery without angina pectoris: ICD-10-CM

## 2018-07-12 DIAGNOSIS — M1990 Unspecified osteoarthritis, unspecified site: ICD-10-CM

## 2018-07-12 DIAGNOSIS — N2889 Other specified disorders of kidney and ureter: ICD-10-CM

## 2018-07-12 DIAGNOSIS — I639 Cerebral infarction, unspecified: ICD-10-CM

## 2018-07-12 DIAGNOSIS — C221 Intrahepatic bile duct carcinoma: Principal | ICD-10-CM

## 2018-07-12 DIAGNOSIS — C649 Malignant neoplasm of unspecified kidney, except renal pelvis: ICD-10-CM

## 2018-07-12 DIAGNOSIS — J438 Other emphysema: ICD-10-CM

## 2018-07-12 MED ORDER — OXALIPLATIN IVPB
85 mg/m2 | Freq: Once | INTRAVENOUS | 0 refills | Status: CN
Start: 2018-07-12 — End: ?

## 2018-07-12 MED ORDER — DEXAMETHASONE 6 MG PO TAB
12 mg | Freq: Once | ORAL | 0 refills | Status: CN
Start: 2018-07-12 — End: ?

## 2018-07-12 MED ORDER — ONDANSETRON HCL 8 MG PO TAB
16 mg | Freq: Once | ORAL | 0 refills | Status: CN
Start: 2018-07-12 — End: ?

## 2018-07-12 MED ORDER — GEMCITABINE IVPB
1000 mg/m2 | Freq: Once | INTRAVENOUS | 0 refills | Status: CN
Start: 2018-07-12 — End: ?

## 2018-07-13 ENCOUNTER — Encounter: Admit: 2018-07-13 | Discharge: 2018-07-13

## 2018-07-13 DIAGNOSIS — K929 Disease of digestive system, unspecified: ICD-10-CM

## 2018-07-13 DIAGNOSIS — R972 Elevated prostate specific antigen [PSA]: ICD-10-CM

## 2018-07-13 DIAGNOSIS — I639 Cerebral infarction, unspecified: ICD-10-CM

## 2018-07-13 DIAGNOSIS — I519 Heart disease, unspecified: ICD-10-CM

## 2018-07-13 DIAGNOSIS — C649 Malignant neoplasm of unspecified kidney, except renal pelvis: ICD-10-CM

## 2018-07-13 DIAGNOSIS — E785 Hyperlipidemia, unspecified: ICD-10-CM

## 2018-07-13 DIAGNOSIS — I10 Essential (primary) hypertension: Principal | ICD-10-CM

## 2018-07-13 DIAGNOSIS — M1990 Unspecified osteoarthritis, unspecified site: ICD-10-CM

## 2018-07-13 DIAGNOSIS — D751 Secondary polycythemia: ICD-10-CM

## 2018-07-13 DIAGNOSIS — H547 Unspecified visual loss: ICD-10-CM

## 2018-07-13 DIAGNOSIS — N189 Chronic kidney disease, unspecified: ICD-10-CM

## 2018-07-13 DIAGNOSIS — K319 Disease of stomach and duodenum, unspecified: ICD-10-CM

## 2018-07-13 DIAGNOSIS — I2510 Atherosclerotic heart disease of native coronary artery without angina pectoris: ICD-10-CM

## 2018-07-13 DIAGNOSIS — M549 Dorsalgia, unspecified: ICD-10-CM

## 2018-07-13 DIAGNOSIS — J438 Other emphysema: ICD-10-CM

## 2018-07-13 DIAGNOSIS — N2889 Other specified disorders of kidney and ureter: ICD-10-CM

## 2018-07-13 DIAGNOSIS — C240 Malignant neoplasm of extrahepatic bile duct: ICD-10-CM

## 2018-07-13 DIAGNOSIS — I499 Cardiac arrhythmia, unspecified: ICD-10-CM

## 2018-07-14 ENCOUNTER — Encounter: Admit: 2018-07-14 | Discharge: 2018-07-14

## 2018-07-14 ENCOUNTER — Ambulatory Visit: Admit: 2018-07-14 | Discharge: 2018-07-15 | Payer: MEDICARE

## 2018-07-14 DIAGNOSIS — I519 Heart disease, unspecified: ICD-10-CM

## 2018-07-14 DIAGNOSIS — K319 Disease of stomach and duodenum, unspecified: ICD-10-CM

## 2018-07-14 DIAGNOSIS — C240 Malignant neoplasm of extrahepatic bile duct: ICD-10-CM

## 2018-07-14 DIAGNOSIS — C649 Malignant neoplasm of unspecified kidney, except renal pelvis: ICD-10-CM

## 2018-07-14 DIAGNOSIS — D751 Secondary polycythemia: ICD-10-CM

## 2018-07-14 DIAGNOSIS — I499 Cardiac arrhythmia, unspecified: ICD-10-CM

## 2018-07-14 DIAGNOSIS — I639 Cerebral infarction, unspecified: ICD-10-CM

## 2018-07-14 DIAGNOSIS — M1990 Unspecified osteoarthritis, unspecified site: ICD-10-CM

## 2018-07-14 DIAGNOSIS — E785 Hyperlipidemia, unspecified: ICD-10-CM

## 2018-07-14 DIAGNOSIS — I10 Essential (primary) hypertension: Principal | ICD-10-CM

## 2018-07-14 DIAGNOSIS — H547 Unspecified visual loss: ICD-10-CM

## 2018-07-14 DIAGNOSIS — I2510 Atherosclerotic heart disease of native coronary artery without angina pectoris: ICD-10-CM

## 2018-07-14 DIAGNOSIS — N189 Chronic kidney disease, unspecified: ICD-10-CM

## 2018-07-14 DIAGNOSIS — R972 Elevated prostate specific antigen [PSA]: ICD-10-CM

## 2018-07-14 DIAGNOSIS — N2889 Other specified disorders of kidney and ureter: ICD-10-CM

## 2018-07-14 DIAGNOSIS — M549 Dorsalgia, unspecified: ICD-10-CM

## 2018-07-14 DIAGNOSIS — J438 Other emphysema: ICD-10-CM

## 2018-07-14 DIAGNOSIS — K929 Disease of digestive system, unspecified: ICD-10-CM

## 2018-07-14 MED ORDER — SIMETHICONE 40 MG/0.6 ML PO DRPS
0 refills | Status: DC
Start: 2018-07-14 — End: 2018-07-19

## 2018-07-14 MED ORDER — LACTATED RINGERS IV SOLP
1000 mL | INTRAVENOUS | 0 refills | Status: DC
Start: 2018-07-14 — End: 2018-07-19

## 2018-07-14 MED ORDER — FENTANYL CITRATE (PF) 50 MCG/ML IJ SOLN
50 ug | INTRAVENOUS | 0 refills | Status: DC | PRN
Start: 2018-07-14 — End: 2018-07-19

## 2018-07-14 MED ORDER — PROPOFOL INJ 10 MG/ML IV VIAL
0 refills | Status: DC
Start: 2018-07-14 — End: 2018-07-14

## 2018-07-14 MED ORDER — LIDOCAINE HCL 20 MG/ML (2 %) IJ SOLN
0 refills | Status: DC
Start: 2018-07-14 — End: 2018-07-14

## 2018-07-14 MED ORDER — LIDOCAINE (PF) 10 MG/ML (1 %) IJ SOLN
.1-2 mL | INTRAMUSCULAR | 0 refills | Status: DC | PRN
Start: 2018-07-14 — End: 2018-07-19

## 2018-07-14 MED ORDER — PROPOFOL 10 MG/ML IV EMUL 50 ML (INFUSION)(AM)(OR)
INTRAVENOUS | 0 refills | Status: DC
Start: 2018-07-14 — End: 2018-07-14

## 2018-07-14 MED ORDER — ONDANSETRON HCL (PF) 4 MG/2 ML IJ SOLN
4 mg | Freq: Once | INTRAVENOUS | 0 refills | Status: AC | PRN
Start: 2018-07-14 — End: ?

## 2018-07-15 DIAGNOSIS — C801 Malignant (primary) neoplasm, unspecified: Principal | ICD-10-CM

## 2018-07-17 ENCOUNTER — Encounter: Admit: 2018-07-17 | Discharge: 2018-07-17

## 2018-07-17 ENCOUNTER — Encounter: Admit: 2018-07-17 | Discharge: 2018-07-18 | Payer: MEDICARE

## 2018-07-17 DIAGNOSIS — J438 Other emphysema: ICD-10-CM

## 2018-07-17 DIAGNOSIS — C240 Malignant neoplasm of extrahepatic bile duct: ICD-10-CM

## 2018-07-17 DIAGNOSIS — E785 Hyperlipidemia, unspecified: ICD-10-CM

## 2018-07-17 DIAGNOSIS — I2510 Atherosclerotic heart disease of native coronary artery without angina pectoris: ICD-10-CM

## 2018-07-17 DIAGNOSIS — C801 Malignant (primary) neoplasm, unspecified: Principal | ICD-10-CM

## 2018-07-17 DIAGNOSIS — H547 Unspecified visual loss: ICD-10-CM

## 2018-07-17 DIAGNOSIS — K319 Disease of stomach and duodenum, unspecified: ICD-10-CM

## 2018-07-17 DIAGNOSIS — I639 Cerebral infarction, unspecified: ICD-10-CM

## 2018-07-17 DIAGNOSIS — C649 Malignant neoplasm of unspecified kidney, except renal pelvis: ICD-10-CM

## 2018-07-17 DIAGNOSIS — D751 Secondary polycythemia: ICD-10-CM

## 2018-07-17 DIAGNOSIS — I10 Essential (primary) hypertension: Principal | ICD-10-CM

## 2018-07-17 DIAGNOSIS — K929 Disease of digestive system, unspecified: ICD-10-CM

## 2018-07-17 DIAGNOSIS — N2889 Other specified disorders of kidney and ureter: ICD-10-CM

## 2018-07-17 DIAGNOSIS — N189 Chronic kidney disease, unspecified: ICD-10-CM

## 2018-07-17 DIAGNOSIS — R972 Elevated prostate specific antigen [PSA]: ICD-10-CM

## 2018-07-17 DIAGNOSIS — M549 Dorsalgia, unspecified: ICD-10-CM

## 2018-07-17 DIAGNOSIS — I499 Cardiac arrhythmia, unspecified: ICD-10-CM

## 2018-07-17 DIAGNOSIS — M1990 Unspecified osteoarthritis, unspecified site: ICD-10-CM

## 2018-07-17 DIAGNOSIS — I519 Heart disease, unspecified: ICD-10-CM

## 2018-07-18 ENCOUNTER — Encounter: Admit: 2018-07-18 | Discharge: 2018-07-18

## 2018-07-24 ENCOUNTER — Encounter: Admit: 2018-07-24 | Discharge: 2018-07-24

## 2018-07-24 ENCOUNTER — Ambulatory Visit: Admit: 2018-07-24 | Discharge: 2018-07-24 | Payer: MEDICARE

## 2018-07-24 DIAGNOSIS — I499 Cardiac arrhythmia, unspecified: ICD-10-CM

## 2018-07-24 DIAGNOSIS — M549 Dorsalgia, unspecified: ICD-10-CM

## 2018-07-24 DIAGNOSIS — Z85528 Personal history of other malignant neoplasm of kidney: ICD-10-CM

## 2018-07-24 DIAGNOSIS — E785 Hyperlipidemia, unspecified: ICD-10-CM

## 2018-07-24 DIAGNOSIS — C649 Malignant neoplasm of unspecified kidney, except renal pelvis: ICD-10-CM

## 2018-07-24 DIAGNOSIS — I2510 Atherosclerotic heart disease of native coronary artery without angina pectoris: ICD-10-CM

## 2018-07-24 DIAGNOSIS — R972 Elevated prostate specific antigen [PSA]: ICD-10-CM

## 2018-07-24 DIAGNOSIS — Z8249 Family history of ischemic heart disease and other diseases of the circulatory system: ICD-10-CM

## 2018-07-24 DIAGNOSIS — K319 Disease of stomach and duodenum, unspecified: ICD-10-CM

## 2018-07-24 DIAGNOSIS — Z452 Encounter for adjustment and management of vascular access device: Secondary | ICD-10-CM

## 2018-07-24 DIAGNOSIS — Z905 Acquired absence of kidney: ICD-10-CM

## 2018-07-24 DIAGNOSIS — D751 Secondary polycythemia: ICD-10-CM

## 2018-07-24 DIAGNOSIS — M1990 Unspecified osteoarthritis, unspecified site: ICD-10-CM

## 2018-07-24 DIAGNOSIS — Z951 Presence of aortocoronary bypass graft: ICD-10-CM

## 2018-07-24 DIAGNOSIS — I10 Essential (primary) hypertension: Principal | ICD-10-CM

## 2018-07-24 DIAGNOSIS — J438 Other emphysema: ICD-10-CM

## 2018-07-24 DIAGNOSIS — I639 Cerebral infarction, unspecified: ICD-10-CM

## 2018-07-24 DIAGNOSIS — I129 Hypertensive chronic kidney disease with stage 1 through stage 4 chronic kidney disease, or unspecified chronic kidney disease: ICD-10-CM

## 2018-07-24 DIAGNOSIS — N2889 Other specified disorders of kidney and ureter: ICD-10-CM

## 2018-07-24 DIAGNOSIS — K929 Disease of digestive system, unspecified: ICD-10-CM

## 2018-07-24 DIAGNOSIS — I519 Heart disease, unspecified: ICD-10-CM

## 2018-07-24 DIAGNOSIS — Z7982 Long term (current) use of aspirin: ICD-10-CM

## 2018-07-24 DIAGNOSIS — Z79899 Other long term (current) drug therapy: ICD-10-CM

## 2018-07-24 DIAGNOSIS — H547 Unspecified visual loss: ICD-10-CM

## 2018-07-24 DIAGNOSIS — N189 Chronic kidney disease, unspecified: ICD-10-CM

## 2018-07-24 DIAGNOSIS — C240 Malignant neoplasm of extrahepatic bile duct: ICD-10-CM

## 2018-07-24 DIAGNOSIS — Z87891 Personal history of nicotine dependence: ICD-10-CM

## 2018-07-24 DIAGNOSIS — C221 Intrahepatic bile duct carcinoma: Principal | ICD-10-CM

## 2018-07-24 DIAGNOSIS — Z8673 Personal history of transient ischemic attack (TIA), and cerebral infarction without residual deficits: ICD-10-CM

## 2018-07-24 MED ORDER — MIDAZOLAM 1 MG/ML IJ SOLN
0 refills | Status: CP
  Administered 2018-07-24 (×2): 1 mg via INTRAVENOUS

## 2018-07-24 MED ORDER — FENTANYL CITRATE (PF) 50 MCG/ML IJ SOLN
0 refills | Status: CP
  Administered 2018-07-24 (×2): 50 ug via INTRAVENOUS

## 2018-07-24 MED ORDER — HEPARIN, PORCINE (PF) 100 UNIT/ML IV SYRG
500 [IU] | Freq: Once | 0 refills | Status: CP
Start: 2018-07-24 — End: ?

## 2018-07-28 ENCOUNTER — Encounter: Admit: 2018-07-28 | Discharge: 2018-07-28

## 2018-07-28 ENCOUNTER — Encounter: Admit: 2018-07-28 | Discharge: 2018-07-28 | Payer: MEDICARE

## 2018-07-28 DIAGNOSIS — D649 Anemia, unspecified: ICD-10-CM

## 2018-07-28 DIAGNOSIS — I2510 Atherosclerotic heart disease of native coronary artery without angina pectoris: ICD-10-CM

## 2018-07-28 DIAGNOSIS — R972 Elevated prostate specific antigen [PSA]: ICD-10-CM

## 2018-07-28 DIAGNOSIS — N184 Chronic kidney disease, stage 4 (severe): ICD-10-CM

## 2018-07-28 DIAGNOSIS — I639 Cerebral infarction, unspecified: ICD-10-CM

## 2018-07-28 DIAGNOSIS — I10 Essential (primary) hypertension: Principal | ICD-10-CM

## 2018-07-28 DIAGNOSIS — I519 Heart disease, unspecified: ICD-10-CM

## 2018-07-28 DIAGNOSIS — N189 Chronic kidney disease, unspecified: ICD-10-CM

## 2018-07-28 DIAGNOSIS — C221 Intrahepatic bile duct carcinoma: Principal | ICD-10-CM

## 2018-07-28 DIAGNOSIS — D751 Secondary polycythemia: ICD-10-CM

## 2018-07-28 DIAGNOSIS — C649 Malignant neoplasm of unspecified kidney, except renal pelvis: ICD-10-CM

## 2018-07-28 DIAGNOSIS — R978 Other abnormal tumor markers: ICD-10-CM

## 2018-07-28 DIAGNOSIS — M549 Dorsalgia, unspecified: ICD-10-CM

## 2018-07-28 DIAGNOSIS — N2889 Other specified disorders of kidney and ureter: ICD-10-CM

## 2018-07-28 DIAGNOSIS — E785 Hyperlipidemia, unspecified: ICD-10-CM

## 2018-07-28 DIAGNOSIS — C240 Malignant neoplasm of extrahepatic bile duct: ICD-10-CM

## 2018-07-28 DIAGNOSIS — H547 Unspecified visual loss: ICD-10-CM

## 2018-07-28 DIAGNOSIS — J438 Other emphysema: ICD-10-CM

## 2018-07-28 DIAGNOSIS — K319 Disease of stomach and duodenum, unspecified: ICD-10-CM

## 2018-07-28 DIAGNOSIS — M1990 Unspecified osteoarthritis, unspecified site: ICD-10-CM

## 2018-07-28 DIAGNOSIS — K929 Disease of digestive system, unspecified: ICD-10-CM

## 2018-07-28 DIAGNOSIS — I499 Cardiac arrhythmia, unspecified: ICD-10-CM

## 2018-07-28 LAB — CBC AND DIFF
Lab: 0.1 10*3/uL (ref 0–0.20)
Lab: 0.5 10*3/uL — ABNORMAL HIGH (ref 0–0.45)
Lab: 1 % — ABNORMAL LOW (ref >60)
Lab: 1 10*3/uL — ABNORMAL HIGH (ref 0–0.80)
Lab: 1.9 10*3/uL (ref 1.0–4.8)
Lab: 14 % (ref 11–15)
Lab: 17 % — ABNORMAL LOW (ref 24–44)
Lab: 434 10*3/uL — ABNORMAL HIGH (ref 150–400)
Lab: 69 % (ref 41–77)
Lab: 8 % (ref 4–12)
Lab: 8.3 FL — ABNORMAL HIGH (ref 7–11)

## 2018-07-28 MED ORDER — PROCHLORPERAZINE MALEATE 5 MG PO TAB
5 mg | ORAL_TABLET | ORAL | 0 refills | Status: AC | PRN
Start: 2018-07-28 — End: 2018-11-20

## 2018-07-28 MED ORDER — DEXAMETHASONE 6 MG PO TAB
12 mg | Freq: Once | ORAL | 0 refills | Status: CN
Start: 2018-07-28 — End: ?

## 2018-07-28 MED ORDER — OXALIPLATIN IVPB
85 mg/m2 | Freq: Once | INTRAVENOUS | 0 refills | Status: CN
Start: 2018-07-28 — End: ?

## 2018-07-28 MED ORDER — DEXAMETHASONE 4 MG PO TAB
ORAL_TABLET | Freq: Two times a day (BID) | INTRAMUSCULAR | 1 refills | 12.50000 days | Status: AC
Start: 2018-07-28 — End: 2018-11-20

## 2018-07-28 MED ORDER — ONDANSETRON HCL 8 MG PO TAB
ORAL_TABLET | Freq: Two times a day (BID) | ORAL | 1 refills | 8.00000 days | Status: AC
Start: 2018-07-28 — End: 2018-09-25

## 2018-07-28 MED ORDER — ONDANSETRON HCL 8 MG PO TAB
16 mg | Freq: Once | ORAL | 0 refills | Status: CN
Start: 2018-07-28 — End: ?

## 2018-07-28 MED ORDER — GEMCITABINE IVPB
1000 mg/m2 | Freq: Once | INTRAVENOUS | 0 refills | Status: CN
Start: 2018-07-28 — End: ?

## 2018-07-31 ENCOUNTER — Encounter: Admit: 2018-07-31 | Discharge: 2018-07-31

## 2018-07-31 ENCOUNTER — Encounter: Admit: 2018-07-31 | Discharge: 2018-07-31 | Payer: MEDICARE

## 2018-07-31 DIAGNOSIS — Z5111 Encounter for antineoplastic chemotherapy: Principal | ICD-10-CM

## 2018-07-31 DIAGNOSIS — C221 Intrahepatic bile duct carcinoma: ICD-10-CM

## 2018-07-31 MED ORDER — ONDANSETRON HCL 8 MG PO TAB
16 mg | Freq: Once | ORAL | 0 refills | Status: CP
Start: 2018-07-31 — End: ?
  Administered 2018-07-31: 17:00:00 16 mg via ORAL

## 2018-07-31 MED ORDER — HEPARIN, PORCINE (PF) 100 UNIT/ML IV SYRG
500 [IU] | Freq: Once | 0 refills | Status: CP
Start: 2018-07-31 — End: ?

## 2018-07-31 MED ORDER — DEXAMETHASONE 6 MG PO TAB
12 mg | Freq: Once | ORAL | 0 refills | Status: CP
Start: 2018-07-31 — End: ?
  Administered 2018-07-31: 17:00:00 12 mg via ORAL

## 2018-07-31 MED ORDER — GEMCITABINE IVPB
1000 mg/m2 | Freq: Once | INTRAVENOUS | 0 refills | Status: CP
Start: 2018-07-31 — End: ?
  Administered 2018-07-31 (×3): 2150.2 mg via INTRAVENOUS

## 2018-07-31 MED ORDER — OXALIPLATIN IVPB
85 mg/m2 | Freq: Once | INTRAVENOUS | 0 refills | Status: CP
Start: 2018-07-31 — End: ?
  Administered 2018-07-31 (×2): 182.75 mg via INTRAVENOUS

## 2018-08-07 ENCOUNTER — Encounter: Admit: 2018-08-07 | Discharge: 2018-08-07

## 2018-08-14 ENCOUNTER — Encounter: Admit: 2018-08-14 | Discharge: 2018-08-14

## 2018-08-14 ENCOUNTER — Encounter: Admit: 2018-08-14 | Discharge: 2018-08-14 | Payer: MEDICARE

## 2018-08-14 DIAGNOSIS — Z87891 Personal history of nicotine dependence: ICD-10-CM

## 2018-08-14 DIAGNOSIS — D45 Polycythemia vera: ICD-10-CM

## 2018-08-14 DIAGNOSIS — J438 Other emphysema: ICD-10-CM

## 2018-08-14 DIAGNOSIS — Z8249 Family history of ischemic heart disease and other diseases of the circulatory system: ICD-10-CM

## 2018-08-14 DIAGNOSIS — I639 Cerebral infarction, unspecified: ICD-10-CM

## 2018-08-14 DIAGNOSIS — C221 Intrahepatic bile duct carcinoma: Principal | ICD-10-CM

## 2018-08-14 DIAGNOSIS — Z8673 Personal history of transient ischemic attack (TIA), and cerebral infarction without residual deficits: ICD-10-CM

## 2018-08-14 DIAGNOSIS — K319 Disease of stomach and duodenum, unspecified: ICD-10-CM

## 2018-08-14 DIAGNOSIS — M549 Dorsalgia, unspecified: ICD-10-CM

## 2018-08-14 DIAGNOSIS — I519 Heart disease, unspecified: ICD-10-CM

## 2018-08-14 DIAGNOSIS — C240 Malignant neoplasm of extrahepatic bile duct: ICD-10-CM

## 2018-08-14 DIAGNOSIS — I499 Cardiac arrhythmia, unspecified: ICD-10-CM

## 2018-08-14 DIAGNOSIS — E785 Hyperlipidemia, unspecified: ICD-10-CM

## 2018-08-14 DIAGNOSIS — I129 Hypertensive chronic kidney disease with stage 1 through stage 4 chronic kidney disease, or unspecified chronic kidney disease: ICD-10-CM

## 2018-08-14 DIAGNOSIS — D751 Secondary polycythemia: ICD-10-CM

## 2018-08-14 DIAGNOSIS — C649 Malignant neoplasm of unspecified kidney, except renal pelvis: ICD-10-CM

## 2018-08-14 DIAGNOSIS — I10 Essential (primary) hypertension: Principal | ICD-10-CM

## 2018-08-14 DIAGNOSIS — I2510 Atherosclerotic heart disease of native coronary artery without angina pectoris: ICD-10-CM

## 2018-08-14 DIAGNOSIS — H547 Unspecified visual loss: ICD-10-CM

## 2018-08-14 DIAGNOSIS — M1990 Unspecified osteoarthritis, unspecified site: ICD-10-CM

## 2018-08-14 DIAGNOSIS — R972 Elevated prostate specific antigen [PSA]: ICD-10-CM

## 2018-08-14 DIAGNOSIS — N2889 Other specified disorders of kidney and ureter: ICD-10-CM

## 2018-08-14 DIAGNOSIS — K929 Disease of digestive system, unspecified: ICD-10-CM

## 2018-08-14 DIAGNOSIS — N189 Chronic kidney disease, unspecified: ICD-10-CM

## 2018-08-14 LAB — ALPHA FETO PROTEIN (AFP): Lab: 3.1 ng/mL (ref 0.0–15.0)

## 2018-08-14 LAB — COMPREHENSIVE METABOLIC PANEL
Lab: 0.4 mg/dL (ref 0.3–1.2)
Lab: 10 mg/dL (ref 8.5–10.6)
Lab: 135 MMOL/L — ABNORMAL LOW (ref 137–147)
Lab: 19 U/L (ref 7–40)
Lab: 2.3 mg/dL — ABNORMAL HIGH (ref 0.4–1.24)
Lab: 24 MMOL/L (ref 21–30)
Lab: 25 U/L (ref 7–56)
Lab: 28 mL/min — ABNORMAL LOW (ref >60)
Lab: 3.8 g/dL (ref 3.5–5.0)
Lab: 30 mg/dL — ABNORMAL HIGH (ref 7–25)
Lab: 34 mL/min — ABNORMAL LOW (ref >60)
Lab: 6 10*3/uL — ABNORMAL HIGH (ref 3–12)
Lab: 7.2 g/dL (ref 6.0–8.0)
Lab: 98 U/L — ABNORMAL LOW (ref 25–110)

## 2018-08-14 LAB — CA19.9: Lab: 21 U/mL — ABNORMAL HIGH (ref <35)

## 2018-08-14 LAB — CBC AND DIFF
Lab: 0.1 10*3/uL (ref 0–0.20)
Lab: 0.3 10*3/uL (ref 0–0.45)
Lab: 10 10*3/uL (ref 4.5–11.0)

## 2018-08-14 LAB — CEA(CARCINOEMBRYONIC AG): Lab: 1 ng/mL (ref <3.0)

## 2018-08-14 MED ORDER — GEMCITABINE IVPB
1000 mg/m2 | Freq: Once | INTRAVENOUS | 0 refills | Status: CP
Start: 2018-08-14 — End: ?
  Administered 2018-08-14 (×3): 2150.2 mg via INTRAVENOUS

## 2018-08-14 MED ORDER — OXALIPLATIN IVPB
85 mg/m2 | Freq: Once | INTRAVENOUS | 0 refills | Status: CP
Start: 2018-08-14 — End: ?
  Administered 2018-08-14 (×2): 182.75 mg via INTRAVENOUS

## 2018-08-14 MED ORDER — HEPARIN, PORCINE (PF) 100 UNIT/ML IV SYRG
500 [IU] | Freq: Once | 0 refills | Status: CP
Start: 2018-08-14 — End: ?

## 2018-08-14 MED ORDER — ONDANSETRON HCL 8 MG PO TAB
16 mg | Freq: Once | ORAL | 0 refills | Status: CP
Start: 2018-08-14 — End: ?
  Administered 2018-08-14: 17:00:00 16 mg via ORAL

## 2018-08-14 MED ORDER — DEXAMETHASONE 6 MG PO TAB
12 mg | Freq: Once | ORAL | 0 refills | Status: CP
Start: 2018-08-14 — End: ?
  Administered 2018-08-14: 17:00:00 12 mg via ORAL

## 2018-08-25 ENCOUNTER — Encounter: Admit: 2018-08-25 | Discharge: 2018-08-25

## 2018-08-25 DIAGNOSIS — C221 Intrahepatic bile duct carcinoma: Secondary | ICD-10-CM

## 2018-08-29 ENCOUNTER — Encounter: Admit: 2018-08-29 | Discharge: 2018-08-29

## 2018-08-29 ENCOUNTER — Encounter: Admit: 2018-08-29 | Discharge: 2018-08-29 | Payer: MEDICARE

## 2018-08-29 DIAGNOSIS — C221 Intrahepatic bile duct carcinoma: Secondary | ICD-10-CM

## 2018-08-29 DIAGNOSIS — R7989 Other specified abnormal findings of blood chemistry: Secondary | ICD-10-CM

## 2018-08-29 DIAGNOSIS — J438 Other emphysema: Secondary | ICD-10-CM

## 2018-08-29 DIAGNOSIS — H547 Unspecified visual loss: Secondary | ICD-10-CM

## 2018-08-29 DIAGNOSIS — I499 Cardiac arrhythmia, unspecified: Secondary | ICD-10-CM

## 2018-08-29 DIAGNOSIS — K319 Disease of stomach and duodenum, unspecified: Secondary | ICD-10-CM

## 2018-08-29 DIAGNOSIS — C649 Malignant neoplasm of unspecified kidney, except renal pelvis: Secondary | ICD-10-CM

## 2018-08-29 DIAGNOSIS — M549 Dorsalgia, unspecified: Secondary | ICD-10-CM

## 2018-08-29 DIAGNOSIS — N184 Chronic kidney disease, stage 4 (severe): Secondary | ICD-10-CM

## 2018-08-29 DIAGNOSIS — I10 Essential (primary) hypertension: Secondary | ICD-10-CM

## 2018-08-29 DIAGNOSIS — R972 Elevated prostate specific antigen [PSA]: Secondary | ICD-10-CM

## 2018-08-29 DIAGNOSIS — R5383 Other fatigue: Secondary | ICD-10-CM

## 2018-08-29 DIAGNOSIS — K929 Disease of digestive system, unspecified: Secondary | ICD-10-CM

## 2018-08-29 DIAGNOSIS — M1990 Unspecified osteoarthritis, unspecified site: Secondary | ICD-10-CM

## 2018-08-29 DIAGNOSIS — D45 Polycythemia vera: Secondary | ICD-10-CM

## 2018-08-29 DIAGNOSIS — N189 Chronic kidney disease, unspecified: Secondary | ICD-10-CM

## 2018-08-29 DIAGNOSIS — D751 Secondary polycythemia: Secondary | ICD-10-CM

## 2018-08-29 DIAGNOSIS — E785 Hyperlipidemia, unspecified: Secondary | ICD-10-CM

## 2018-08-29 DIAGNOSIS — R110 Nausea: Secondary | ICD-10-CM

## 2018-08-29 DIAGNOSIS — C240 Malignant neoplasm of extrahepatic bile duct: Secondary | ICD-10-CM

## 2018-08-29 DIAGNOSIS — I2510 Atherosclerotic heart disease of native coronary artery without angina pectoris: Secondary | ICD-10-CM

## 2018-08-29 DIAGNOSIS — I519 Heart disease, unspecified: Secondary | ICD-10-CM

## 2018-08-29 DIAGNOSIS — N2889 Other specified disorders of kidney and ureter: Secondary | ICD-10-CM

## 2018-08-29 DIAGNOSIS — I639 Cerebral infarction, unspecified: Secondary | ICD-10-CM

## 2018-08-29 LAB — CBC AND DIFF
Lab: 0.2 10*3/uL (ref 0–0.45)
Lab: 14 10*3/uL — ABNORMAL HIGH (ref 4.5–11.0)

## 2018-08-29 LAB — COMPREHENSIVE METABOLIC PANEL
Lab: 135 MMOL/L — ABNORMAL LOW (ref 137–147)
Lab: 2.5 mg/dL — ABNORMAL HIGH (ref 0.4–1.24)
Lab: 25 mL/min — ABNORMAL LOW (ref >60)
Lab: 28 mg/dL — ABNORMAL HIGH (ref 7–25)

## 2018-08-29 LAB — CEA(CARCINOEMBRYONIC AG): Lab: 2.3 ng/mL — ABNORMAL LOW (ref <3.0)

## 2018-08-29 LAB — ALPHA FETO PROTEIN (AFP): Lab: 3.3 ng/mL — ABNORMAL LOW (ref 0.0–15.0)

## 2018-08-29 LAB — CA19.9: Lab: 31 U/mL (ref <35)

## 2018-08-29 MED ORDER — ONDANSETRON HCL 8 MG PO TAB
16 mg | Freq: Once | ORAL | 0 refills | Status: CP
Start: 2018-08-29 — End: ?
  Administered 2018-08-29: 21:00:00 16 mg via ORAL

## 2018-08-29 MED ORDER — GEMCITABINE IVPB
1000 mg/m2 | Freq: Once | INTRAVENOUS | 0 refills | Status: CP
Start: 2018-08-29 — End: ?
  Administered 2018-08-29 (×3): 2150.2 mg via INTRAVENOUS

## 2018-08-29 MED ORDER — HEPARIN, PORCINE (PF) 100 UNIT/ML IV SYRG
500 [IU] | Freq: Once | 0 refills | Status: CP
Start: 2018-08-29 — End: ?

## 2018-08-29 MED ORDER — OXALIPLATIN IVPB
85 mg/m2 | Freq: Once | INTRAVENOUS | 0 refills | Status: CP
Start: 2018-08-29 — End: ?
  Administered 2018-08-29 (×2): 182.75 mg via INTRAVENOUS

## 2018-08-29 MED ORDER — DEXAMETHASONE 6 MG PO TAB
12 mg | Freq: Once | ORAL | 0 refills | Status: CP
Start: 2018-08-29 — End: ?
  Administered 2018-08-29: 21:00:00 12 mg via ORAL

## 2018-09-13 ENCOUNTER — Encounter: Admit: 2018-09-13 | Discharge: 2018-09-13

## 2018-09-13 ENCOUNTER — Encounter: Admit: 2018-09-13 | Discharge: 2018-09-13 | Payer: MEDICARE

## 2018-09-13 DIAGNOSIS — C649 Malignant neoplasm of unspecified kidney, except renal pelvis: Secondary | ICD-10-CM

## 2018-09-13 DIAGNOSIS — M1990 Unspecified osteoarthritis, unspecified site: Secondary | ICD-10-CM

## 2018-09-13 DIAGNOSIS — C221 Intrahepatic bile duct carcinoma: Secondary | ICD-10-CM

## 2018-09-13 DIAGNOSIS — D45 Polycythemia vera: Secondary | ICD-10-CM

## 2018-09-13 DIAGNOSIS — N2889 Other specified disorders of kidney and ureter: Secondary | ICD-10-CM

## 2018-09-13 DIAGNOSIS — N189 Chronic kidney disease, unspecified: Secondary | ICD-10-CM

## 2018-09-13 DIAGNOSIS — I639 Cerebral infarction, unspecified: Secondary | ICD-10-CM

## 2018-09-13 DIAGNOSIS — I10 Essential (primary) hypertension: Secondary | ICD-10-CM

## 2018-09-13 DIAGNOSIS — K319 Disease of stomach and duodenum, unspecified: Secondary | ICD-10-CM

## 2018-09-13 DIAGNOSIS — R972 Elevated prostate specific antigen [PSA]: Secondary | ICD-10-CM

## 2018-09-13 DIAGNOSIS — M549 Dorsalgia, unspecified: Secondary | ICD-10-CM

## 2018-09-13 DIAGNOSIS — D751 Secondary polycythemia: Secondary | ICD-10-CM

## 2018-09-13 DIAGNOSIS — I519 Heart disease, unspecified: Secondary | ICD-10-CM

## 2018-09-13 DIAGNOSIS — H547 Unspecified visual loss: Secondary | ICD-10-CM

## 2018-09-13 DIAGNOSIS — J438 Other emphysema: Secondary | ICD-10-CM

## 2018-09-13 DIAGNOSIS — E785 Hyperlipidemia, unspecified: Secondary | ICD-10-CM

## 2018-09-13 DIAGNOSIS — I499 Cardiac arrhythmia, unspecified: Secondary | ICD-10-CM

## 2018-09-13 DIAGNOSIS — I2510 Atherosclerotic heart disease of native coronary artery without angina pectoris: Secondary | ICD-10-CM

## 2018-09-13 DIAGNOSIS — K929 Disease of digestive system, unspecified: Secondary | ICD-10-CM

## 2018-09-13 DIAGNOSIS — C240 Malignant neoplasm of extrahepatic bile duct: Secondary | ICD-10-CM

## 2018-09-13 LAB — COMPREHENSIVE METABOLIC PANEL
Lab: 31 mL/min — ABNORMAL LOW (ref >60)
Lab: 6 % — ABNORMAL HIGH (ref 3–12)

## 2018-09-13 LAB — CBC AND DIFF: Lab: 4.9 10*3/uL (ref 1.8–7.0)

## 2018-09-13 MED ORDER — OXALIPLATIN IVPB
85 mg/m2 | Freq: Once | INTRAVENOUS | 0 refills | Status: CP
Start: 2018-09-13 — End: ?
  Administered 2018-09-13 (×2): 182.75 mg via INTRAVENOUS

## 2018-09-13 MED ORDER — ONDANSETRON HCL 8 MG PO TAB
16 mg | Freq: Once | ORAL | 0 refills | Status: CP
Start: 2018-09-13 — End: ?
  Administered 2018-09-13: 15:00:00 16 mg via ORAL

## 2018-09-13 MED ORDER — SODIUM POLYSTYRENE SULFONATE 15 GRAM/60 ML PO SUSP
15 g | Freq: Once | ORAL | 0 refills | Status: CP
Start: 2018-09-13 — End: ?
  Administered 2018-09-13: 16:00:00 15 g via ORAL

## 2018-09-13 MED ORDER — DEXAMETHASONE 6 MG PO TAB
12 mg | Freq: Once | ORAL | 0 refills | Status: CP
Start: 2018-09-13 — End: ?
  Administered 2018-09-13: 15:00:00 12 mg via ORAL

## 2018-09-13 MED ORDER — HEPARIN, PORCINE (PF) 100 UNIT/ML IV SYRG
500 [IU] | Freq: Once | 0 refills | Status: CP
Start: 2018-09-13 — End: ?

## 2018-09-13 MED ORDER — GEMCITABINE IVPB
1000 mg/m2 | Freq: Once | INTRAVENOUS | 0 refills | Status: CP
Start: 2018-09-13 — End: ?
  Administered 2018-09-13 (×2): 2150.2 mg via INTRAVENOUS

## 2018-09-21 ENCOUNTER — Encounter: Admit: 2018-09-21 | Discharge: 2018-09-21

## 2018-09-21 MED ORDER — DEXAMETHASONE 6 MG PO TAB
12 mg | Freq: Once | ORAL | 0 refills | Status: CN
Start: 2018-09-21 — End: ?

## 2018-09-21 MED ORDER — OXALIPLATIN IVPB
85 mg/m2 | Freq: Once | INTRAVENOUS | 0 refills | Status: CN
Start: 2018-09-21 — End: ?

## 2018-09-21 MED ORDER — ONDANSETRON HCL 8 MG PO TAB
16 mg | Freq: Once | ORAL | 0 refills | Status: CN
Start: 2018-09-21 — End: ?

## 2018-09-21 MED ORDER — GEMCITABINE IVPB
1000 mg/m2 | Freq: Once | INTRAVENOUS | 0 refills | Status: CN
Start: 2018-09-21 — End: ?

## 2018-09-22 ENCOUNTER — Encounter: Admit: 2018-09-22 | Discharge: 2018-09-22 | Payer: MEDICARE

## 2018-09-22 DIAGNOSIS — C221 Intrahepatic bile duct carcinoma: Secondary | ICD-10-CM

## 2018-09-25 ENCOUNTER — Encounter: Admit: 2018-09-25 | Discharge: 2018-09-25

## 2018-09-25 ENCOUNTER — Encounter: Admit: 2018-09-25 | Discharge: 2018-09-25 | Payer: MEDICARE

## 2018-09-25 DIAGNOSIS — D45 Polycythemia vera: ICD-10-CM

## 2018-09-25 DIAGNOSIS — I129 Hypertensive chronic kidney disease with stage 1 through stage 4 chronic kidney disease, or unspecified chronic kidney disease: ICD-10-CM

## 2018-09-25 DIAGNOSIS — E785 Hyperlipidemia, unspecified: ICD-10-CM

## 2018-09-25 DIAGNOSIS — K319 Disease of stomach and duodenum, unspecified: ICD-10-CM

## 2018-09-25 DIAGNOSIS — Z87891 Personal history of nicotine dependence: ICD-10-CM

## 2018-09-25 DIAGNOSIS — N184 Chronic kidney disease, stage 4 (severe): ICD-10-CM

## 2018-09-25 DIAGNOSIS — C221 Intrahepatic bile duct carcinoma: Principal | ICD-10-CM

## 2018-09-25 DIAGNOSIS — C649 Malignant neoplasm of unspecified kidney, except renal pelvis: ICD-10-CM

## 2018-09-25 DIAGNOSIS — H547 Unspecified visual loss: ICD-10-CM

## 2018-09-25 DIAGNOSIS — R110 Nausea: ICD-10-CM

## 2018-09-25 DIAGNOSIS — D751 Secondary polycythemia: ICD-10-CM

## 2018-09-25 DIAGNOSIS — E875 Hyperkalemia: ICD-10-CM

## 2018-09-25 DIAGNOSIS — M549 Dorsalgia, unspecified: ICD-10-CM

## 2018-09-25 DIAGNOSIS — N189 Chronic kidney disease, unspecified: ICD-10-CM

## 2018-09-25 DIAGNOSIS — I10 Essential (primary) hypertension: Principal | ICD-10-CM

## 2018-09-25 DIAGNOSIS — J438 Other emphysema: ICD-10-CM

## 2018-09-25 DIAGNOSIS — K929 Disease of digestive system, unspecified: ICD-10-CM

## 2018-09-25 DIAGNOSIS — I639 Cerebral infarction, unspecified: ICD-10-CM

## 2018-09-25 DIAGNOSIS — R5383 Other fatigue: ICD-10-CM

## 2018-09-25 DIAGNOSIS — Z8673 Personal history of transient ischemic attack (TIA), and cerebral infarction without residual deficits: ICD-10-CM

## 2018-09-25 DIAGNOSIS — I519 Heart disease, unspecified: ICD-10-CM

## 2018-09-25 DIAGNOSIS — C240 Malignant neoplasm of extrahepatic bile duct: ICD-10-CM

## 2018-09-25 DIAGNOSIS — K219 Gastro-esophageal reflux disease without esophagitis: ICD-10-CM

## 2018-09-25 DIAGNOSIS — M1990 Unspecified osteoarthritis, unspecified site: ICD-10-CM

## 2018-09-25 DIAGNOSIS — I2510 Atherosclerotic heart disease of native coronary artery without angina pectoris: ICD-10-CM

## 2018-09-25 DIAGNOSIS — N2889 Other specified disorders of kidney and ureter: ICD-10-CM

## 2018-09-25 DIAGNOSIS — R972 Elevated prostate specific antigen [PSA]: ICD-10-CM

## 2018-09-25 DIAGNOSIS — I499 Cardiac arrhythmia, unspecified: ICD-10-CM

## 2018-09-25 LAB — COMPREHENSIVE METABOLIC PANEL
Lab: 0.4 mg/dL (ref 0.3–1.2)
Lab: 134 MMOL/L — ABNORMAL LOW (ref 137–147)
Lab: 2.8 mg/dL — ABNORMAL HIGH (ref 0.4–1.24)
Lab: 22 MMOL/L (ref 21–30)
Lab: 22 U/L — ABNORMAL HIGH (ref 7–40)
Lab: 22 mL/min — ABNORMAL LOW (ref >60)
Lab: 27 mL/min — ABNORMAL LOW (ref >60)
Lab: 3.2 g/dL — ABNORMAL LOW (ref 3.5–5.0)
Lab: 36 mg/dL — ABNORMAL HIGH (ref 7–25)
Lab: 39 U/L (ref 7–56)
Lab: 8 10*3/uL (ref 3–12)
Lab: 9 mg/dL (ref 8.5–10.6)
Lab: 99 U/L — ABNORMAL LOW (ref 25–110)

## 2018-09-25 LAB — CEA(CARCINOEMBRYONIC AG): Lab: 1.5 ng/mL — ABNORMAL LOW (ref <3.0)

## 2018-09-25 LAB — CA19.9: Lab: 52 U/mL — ABNORMAL HIGH (ref <35)

## 2018-09-25 LAB — ALPHA FETO PROTEIN (AFP): Lab: 3.1 ng/mL — ABNORMAL LOW (ref 0.0–15.0)

## 2018-09-25 LAB — CBC AND DIFF
Lab: 0.1 10*3/uL (ref 0–0.20)
Lab: 0.2 10*3/uL (ref 0–0.45)
Lab: 10 10*3/uL (ref 4.5–11.0)

## 2018-09-25 MED ORDER — ONDANSETRON HCL 8 MG PO TAB
ORAL_TABLET | Freq: Two times a day (BID) | ORAL | 1 refills | 8.00000 days | Status: AC
Start: 2018-09-25 — End: 2018-11-20

## 2018-09-25 MED ORDER — OXALIPLATIN IVPB
65 mg/m2 | Freq: Once | INTRAVENOUS | 0 refills | Status: CP
Start: 2018-09-25 — End: ?
  Administered 2018-09-25 (×3): 139.75 mg via INTRAVENOUS

## 2018-09-25 MED ORDER — DEXAMETHASONE 6 MG PO TAB
12 mg | Freq: Once | ORAL | 0 refills | Status: CP
Start: 2018-09-25 — End: ?
  Administered 2018-09-25: 19:00:00 12 mg via ORAL

## 2018-09-25 MED ORDER — OXALIPLATIN IVPB
65 mg/m2 | Freq: Once | INTRAVENOUS | 0 refills | Status: CN
Start: 2018-09-25 — End: ?

## 2018-09-25 MED ORDER — GEMCITABINE IVPB
1000 mg/m2 | Freq: Once | INTRAVENOUS | 0 refills | Status: CP
Start: 2018-09-25 — End: ?
  Administered 2018-09-25 (×3): 2150.2 mg via INTRAVENOUS

## 2018-09-25 MED ORDER — HEPARIN, PORCINE (PF) 100 UNIT/ML IV SYRG
500 [IU] | Freq: Once | 0 refills | Status: CP
Start: 2018-09-25 — End: ?

## 2018-09-25 MED ORDER — ONDANSETRON HCL 8 MG PO TAB
16 mg | Freq: Once | ORAL | 0 refills | Status: CP
Start: 2018-09-25 — End: ?
  Administered 2018-09-25: 19:00:00 16 mg via ORAL

## 2018-09-26 ENCOUNTER — Encounter: Admit: 2018-09-26 | Discharge: 2018-09-26

## 2018-09-26 DIAGNOSIS — M549 Dorsalgia, unspecified: ICD-10-CM

## 2018-09-26 DIAGNOSIS — I10 Essential (primary) hypertension: Principal | ICD-10-CM

## 2018-09-26 DIAGNOSIS — K319 Disease of stomach and duodenum, unspecified: ICD-10-CM

## 2018-09-26 DIAGNOSIS — J438 Other emphysema: ICD-10-CM

## 2018-09-26 DIAGNOSIS — I639 Cerebral infarction, unspecified: ICD-10-CM

## 2018-09-26 DIAGNOSIS — I519 Heart disease, unspecified: ICD-10-CM

## 2018-09-26 DIAGNOSIS — R972 Elevated prostate specific antigen [PSA]: ICD-10-CM

## 2018-09-26 DIAGNOSIS — C649 Malignant neoplasm of unspecified kidney, except renal pelvis: ICD-10-CM

## 2018-09-26 DIAGNOSIS — I499 Cardiac arrhythmia, unspecified: ICD-10-CM

## 2018-09-26 DIAGNOSIS — N189 Chronic kidney disease, unspecified: ICD-10-CM

## 2018-09-26 DIAGNOSIS — N2889 Other specified disorders of kidney and ureter: ICD-10-CM

## 2018-09-26 DIAGNOSIS — C240 Malignant neoplasm of extrahepatic bile duct: ICD-10-CM

## 2018-09-26 DIAGNOSIS — H547 Unspecified visual loss: ICD-10-CM

## 2018-09-26 DIAGNOSIS — K929 Disease of digestive system, unspecified: ICD-10-CM

## 2018-09-26 DIAGNOSIS — M1990 Unspecified osteoarthritis, unspecified site: ICD-10-CM

## 2018-09-26 DIAGNOSIS — D751 Secondary polycythemia: ICD-10-CM

## 2018-09-26 DIAGNOSIS — I2510 Atherosclerotic heart disease of native coronary artery without angina pectoris: ICD-10-CM

## 2018-09-26 DIAGNOSIS — E785 Hyperlipidemia, unspecified: ICD-10-CM

## 2018-10-02 ENCOUNTER — Ambulatory Visit: Admit: 2018-10-02 | Discharge: 2018-10-03 | Payer: MEDICARE

## 2018-10-02 NOTE — Progress Notes
ALKPHOS 99 09/25/2018 10:54 AM    AST 22 09/25/2018 10:54 AM    ALT 39 09/25/2018 10:54 AM    TOTBILI 0.4 09/25/2018 10:54 AM    GFR 22 (L) 09/25/2018 10:54 AM    GFRAA 27 (L) 09/25/2018 10:54 AM        Creatinine   Date Value Ref Range Status   09/25/2018 2.83 (H) 0.4 - 1.24 MG/DL Final   63/08/6008 9.32 (H) 0.4 - 1.24 MG/DL Final   35/57/3220 2.54 (H) 0.4 - 1.24 MG/DL Final   27/01/2375 2.83 (H) 0.4 - 1.24 MG/DL Final   15/17/6160 7.37 (H) 0.4 - 1.24 MG/DL Final   10/62/6948 5.46 (H) 0.4 - 1.24 MG/DL Final   27/10/5007 3.81 (H) 0.4 - 1.24 MG/DL Final   82/99/3716 9.67 (H) 0.4 - 1.24 MG/DL Final   89/38/1017 5.10 (H) 0.4 - 1.24 MG/DL Final   25/85/2778 2.42 (H) 0.4 - 1.24 MG/DL Final          Assessment and Plan:  1. Stage 4 CKD  - He had preexisting CKD; unclear etiology  - Creatinine was 1.8 mg/dl and now baseline serum creatinine seems close to 3 mg/dl.   - Serum creatinine 2.87 mg/dl today  - Hold off on resumption of ARB; has low to moderate range proteinuria    2. Hypertension  - Home BP readings are better  - Continue amlodipine and carvedilol.     3. Polycythemia Vera  - Had been getting phlebotomy and then hydroxyurea.  - Relatively iron deficient.   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 Hyperkalemia  - Related to CKD in large part  - Try lower dose HCTZ 12.5 mg a couple times per week, especially when feeling better and off chemo    RTC 3 month

## 2018-10-03 DIAGNOSIS — N184 Chronic kidney disease, stage 4 (severe): Principal | ICD-10-CM

## 2018-10-03 DIAGNOSIS — I10 Essential (primary) hypertension: ICD-10-CM

## 2018-10-03 DIAGNOSIS — E875 Hyperkalemia: ICD-10-CM

## 2018-10-03 DIAGNOSIS — C221 Intrahepatic bile duct carcinoma: ICD-10-CM

## 2018-10-03 DIAGNOSIS — D45 Polycythemia vera: Secondary | ICD-10-CM

## 2018-10-09 ENCOUNTER — Encounter: Admit: 2018-10-09 | Discharge: 2018-10-09 | Payer: MEDICARE

## 2018-10-09 ENCOUNTER — Encounter: Admit: 2018-10-09 | Discharge: 2018-10-09

## 2018-10-09 DIAGNOSIS — Z87891 Personal history of nicotine dependence: ICD-10-CM

## 2018-10-09 DIAGNOSIS — C221 Intrahepatic bile duct carcinoma: Principal | ICD-10-CM

## 2018-10-09 DIAGNOSIS — K219 Gastro-esophageal reflux disease without esophagitis: ICD-10-CM

## 2018-10-09 DIAGNOSIS — I129 Hypertensive chronic kidney disease with stage 1 through stage 4 chronic kidney disease, or unspecified chronic kidney disease: ICD-10-CM

## 2018-10-09 DIAGNOSIS — N184 Chronic kidney disease, stage 4 (severe): ICD-10-CM

## 2018-10-09 DIAGNOSIS — Z8673 Personal history of transient ischemic attack (TIA), and cerebral infarction without residual deficits: ICD-10-CM

## 2018-10-09 DIAGNOSIS — R5383 Other fatigue: ICD-10-CM

## 2018-10-09 DIAGNOSIS — E875 Hyperkalemia: ICD-10-CM

## 2018-10-09 LAB — CBC AND DIFF
Lab: 0.1 10*3/uL (ref 0–0.20)
Lab: 0.3 10*3/uL (ref 0–0.45)
Lab: 1 % (ref 0–2)
Lab: 10 10*3/uL (ref 4.5–11.0)
Lab: 3 % (ref 0–5)
Lab: 3.4 M/UL — ABNORMAL LOW (ref 4.4–5.5)
Lab: 87 FL — ABNORMAL HIGH (ref <35)

## 2018-10-09 LAB — COMPREHENSIVE METABOLIC PANEL
Lab: 0.4 mg/dL (ref 0.3–1.2)
Lab: 2.9 mg/dL — ABNORMAL HIGH (ref 0.4–1.24)
Lab: 21 mL/min — ABNORMAL LOW (ref >60)
Lab: 25 U/L — ABNORMAL HIGH (ref 7–40)
Lab: 25 mL/min — ABNORMAL LOW (ref >60)
Lab: 3.4 g/dL — ABNORMAL LOW (ref 3.5–5.0)
Lab: 33 mg/dL — ABNORMAL HIGH (ref 7–25)
Lab: 4.9 MMOL/L — ABNORMAL LOW (ref 3.5–5.1)
Lab: 6.2 g/dL (ref 6.0–8.0)
Lab: 7 10*3/uL (ref 3–12)
Lab: 9.5 mg/dL — ABNORMAL HIGH (ref 8.5–10.6)
Lab: 91 U/L — ABNORMAL LOW (ref 25–110)

## 2018-10-09 LAB — CEA(CARCINOEMBRYONIC AG): Lab: 1.5 ng/mL — ABNORMAL LOW (ref <3.0)

## 2018-10-09 MED ORDER — DEXAMETHASONE 6 MG PO TAB
12 mg | Freq: Once | ORAL | 0 refills | Status: CP
Start: 2018-10-09 — End: ?
  Administered 2018-10-09: 19:00:00 12 mg via ORAL

## 2018-10-09 MED ORDER — HEPARIN, PORCINE (PF) 100 UNIT/ML IV SYRG
500 [IU] | Freq: Once | 0 refills | Status: CP
Start: 2018-10-09 — End: ?

## 2018-10-09 MED ORDER — OXALIPLATIN IVPB
65 mg/m2 | Freq: Once | INTRAVENOUS | 0 refills | Status: CN
Start: 2018-10-09 — End: ?

## 2018-10-09 MED ORDER — ONDANSETRON HCL 8 MG PO TAB
16 mg | Freq: Once | ORAL | 0 refills | Status: CP
Start: 2018-10-09 — End: ?
  Administered 2018-10-09: 19:00:00 16 mg via ORAL

## 2018-10-09 MED ORDER — GEMCITABINE IVPB
1000 mg/m2 | Freq: Once | INTRAVENOUS | 0 refills | Status: CP
Start: 2018-10-09 — End: ?
  Administered 2018-10-09 (×3): 2150.2 mg via INTRAVENOUS

## 2018-10-09 MED ORDER — OXALIPLATIN IVPB
65 mg/m2 | Freq: Once | INTRAVENOUS | 0 refills | Status: CP
Start: 2018-10-09 — End: ?
  Administered 2018-10-09 (×3): 139.75 mg via INTRAVENOUS

## 2018-10-09 NOTE — Progress Notes
Name: Chris Lambert          MRN: [id number]      DOB: 03-15-1946      AGE: 73 y.o.   DATE OF SERVICE: 10/09/2018    Subjective:             Reason for Visit:  Cancer    Chris Lambert is a 73 y.o. male.     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 Biopsy = Poorly differentiated adenocarcinoma. positive for CK7 and negative for CK20, TTF-1, CDX-2, PAX-8, NKX 3.1, Hep-par1 and arginase-1. This immunoprofile is not specific but is compatible with upper gastrointestinal or pancreaticobiliary (including intrahepatic cholangiocarcinoma) origins. ???colonoscopy and EGD negative for original tumor. Seen by hepatobiliary surgery and recommended chemotherapy prior to any surgery. No surgery at this time due to current size and location of tumor. Imaging negative for distant metastatic disease. Pt agreed to chemotherapy. Port placed. Plan on gemcitabine and oxaliplatin (given renal failure, unable to use cisplatin). C1 started on 07/28/18      Interval history:  He has cold sensitivity in his mouth and fingers for the first week of treatment. It feels like needles in his fingers but does not hurt. It goes away after a week and does not interfere with function. His nausea and upset stomach improved after switching to nexium daily.        Review of Systems   Constitutional: Positive for fatigue. Negative for activity change, appetite change, chills, diaphoresis, fever and unexpected weight change.   HENT: Negative for facial swelling, mouth sores, nosebleeds, sore throat and trouble swallowing.    Eyes: Negative.    Respiratory: Negative.  Negative for cough, chest tightness, shortness of breath and wheezing.    Cardiovascular: Negative.  Negative for chest pain, palpitations and leg swelling. ??? polyethylene glycol 3350 (GLYCOLAX; MIRALAX) 17 gram/dose powder Take seventeen g by mouth daily.   ??? prochlorperazine (COMPAZINE) 5 mg tablet Take one tablet by mouth every 6 hours as needed for Nausea.   ??? tamsulosin (FLOMAX) 0.4 mg capsule Take 0.4 mg by mouth daily with dinner.   ??? traMADol (ULTRAM) 50 mg tablet Take 50 mg by mouth every 6 hours as needed.   ??? vitamins, multiple cap Take 1 capsule by mouth daily.     Vitals:    10/09/18 1105   BP: 157/87   BP Source: Arm, Left Upper   Patient Position: Sitting   Pulse: 86   Resp: 16   Temp: 36.4 ???C (97.5 ???F)   TempSrc: Oral   SpO2: 99%   Weight: 93.4 kg (206 lb)   Height: 177.8 cm (70)   PainSc: Two     Body mass index is 29.56 kg/m???.     Pain Score: Two       Fatigue Scale: 2    Pain Addressed:  N/A current regimen working    Patient Evaluated for a Clinical Trial: No treatment clinical trial available for this patient.     [place] Cooperative Oncology Group performance status is 1, Restricted in physically strenuous activity but ambulatory and able to carry out work of a light or sedentary nature, e.g., light house work, office work.     Physical Exam  Vitals signs reviewed.   Constitutional:  General: He is not in acute distress.     Appearance: Normal appearance. He is well-developed. He is not ill-appearing, toxic-appearing or diaphoretic.   HENT:      Head: Normocephalic and atraumatic.      Nose: Nose normal. No congestion or rhinorrhea.      Mouth/Throat:      Mouth: Mucous membranes are not pale, not dry and not cyanotic. No oral lesions.      Pharynx: Oropharynx is clear. No oropharyngeal exudate or posterior oropharyngeal erythema.   Eyes:      General: No scleral icterus.        Right eye: No discharge.         Left eye: No discharge.      Extraocular Movements: Extraocular movements intact.      Conjunctiva/sclera: Conjunctivae normal.      Pupils: Pupils are equal, round, and reactive to light.   Neck: renal cysts, incompletely characterized in the absence of IV contrast.    5. Cholelithiasis and distal colonic diverticulosis.    6. Small umbilical hernia containing a loop of nonobstructed small bowel.         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 positive for CK7 and negative for CK20, TTF-1, CDX-2, PAX-8, NKX 3.1, Hep-par1 and arginase-1. This immunoprofile is not specific but is compatible with upper gastrointestinal or pancreaticobiliary (including intrahepatic cholangiocarcinoma) origins. ???colonoscopy and EGD negative for original tumor. Seen by hepatobiliary surgery and recommended chemotherapy prior to any surgery. No surgery at this time due to current size and location of tumor. Imaging negative for distant metastatic disease. Pt agreed to chemotherapy. Port placed. Plan on gemcitabine and oxaliplatin (given renal failure, unable to use cisplatin). TEMPUS with IDH1 mutation, p73m kdm6A. s/p C4 of Gem+oxaliplatin. CT 09/25/18 = Decrease in size of hypodense mass in segment 4 of the liver.   Plan:  Cont chemo q 2 weeks, Decrease Oxaliplatin to 65 mg/m2 for C5 and reassess further dosing if creatinine improves.   It is stable today, so continue oxaliplatin 65 mg/m2  Decadron on day one only as pre med, hold at home for GERD.  F/u genetic counselor in the setting of multiple hx of cancers. Scheduled for tomorrow.   Cycle 5 today 2/17  RTC in 2 weeks with labs for visit and cycle 6    2. 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 Path = Three foci of renal papillary carcinoma are present: Largest tumor measures 7.3 x 6.9 x 6.2 cm. Additional tumors measure 4.1 cm and 1.9 cm.  He had a decline renal function has recently been followed with Dr. Rocco Serene in nephrology and Dr. Perlie Gold in urology. ???  ??? 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 ???He follows with a hematologist at [facility] in [place], [place]. No longer on hydroxyuria or having phlebotomy    4. Hypertension. currently managed with carvedilol 25 mg bid.and amlodipine. BP elevated in clinic. BP has been running 140s/80s at home. 157/87 today 2/17. F/u with Nephrology. He was started on HCTZ on 09/14/18 by PCP and stopped on 09/22/18 HCTZ due to polyuria.    5. Stage 4 CKD. He had preexisting CKD; unclear etiology. Creatinine was 1.8 mg/dl and now baseline serum creatinine about 3 mg/dl. Today it is 2.99. continue dose reduction of oxaliplatin, 65 mg/m2. Follows with  Dr. Rocco Serene     6. Nausea.  Chemo. controlled with PRN zofran. More nausea during week off following C4 likely due to GERD. Better with PPI. Holding scheduled decadron at home.    7. Fatigue. From chemo, tolerable at grade one. Continue treatment.     8. Cold sensitivities. mild, however lasting almost 2 weeks. Continue dose reduction for creatinine and monitor.   ???  9. Hyperkalemia. K is high, one dose of sodium kayexelate. Now K normal.   ???         Chris Moris, APRN    The patient and spouse were allowed to ask questions and voice concerns; these were addressed to the best of our ability. They expressed understanding of what was explained to them, and they agreed with the present plan. RTC in 2 weeks with labs to see a provider. Patient has the phone numbers for the Cancer Center and was instructed on how to contact us with any questions or concerns. My collaborating physician on this case is Dr. Tamsen Meek.

## 2018-10-10 ENCOUNTER — Encounter: Admit: 2018-10-10 | Discharge: 2018-10-10

## 2018-10-10 ENCOUNTER — Encounter: Admit: 2018-10-10 | Discharge: 2018-10-10 | Payer: MEDICARE

## 2018-10-11 ENCOUNTER — Encounter: Admit: 2018-10-11 | Discharge: 2018-10-11

## 2018-10-11 DIAGNOSIS — N2889 Other specified disorders of kidney and ureter: ICD-10-CM

## 2018-10-11 DIAGNOSIS — I499 Cardiac arrhythmia, unspecified: ICD-10-CM

## 2018-10-11 DIAGNOSIS — K929 Disease of digestive system, unspecified: ICD-10-CM

## 2018-10-11 DIAGNOSIS — E785 Hyperlipidemia, unspecified: ICD-10-CM

## 2018-10-11 DIAGNOSIS — I519 Heart disease, unspecified: ICD-10-CM

## 2018-10-11 DIAGNOSIS — I2510 Atherosclerotic heart disease of native coronary artery without angina pectoris: ICD-10-CM

## 2018-10-11 DIAGNOSIS — I639 Cerebral infarction, unspecified: ICD-10-CM

## 2018-10-11 DIAGNOSIS — J438 Other emphysema: ICD-10-CM

## 2018-10-11 DIAGNOSIS — I10 Essential (primary) hypertension: Principal | ICD-10-CM

## 2018-10-11 DIAGNOSIS — N189 Chronic kidney disease, unspecified: ICD-10-CM

## 2018-10-11 DIAGNOSIS — R972 Elevated prostate specific antigen [PSA]: ICD-10-CM

## 2018-10-11 DIAGNOSIS — K319 Disease of stomach and duodenum, unspecified: ICD-10-CM

## 2018-10-11 DIAGNOSIS — C649 Malignant neoplasm of unspecified kidney, except renal pelvis: ICD-10-CM

## 2018-10-11 DIAGNOSIS — H547 Unspecified visual loss: ICD-10-CM

## 2018-10-11 DIAGNOSIS — D751 Secondary polycythemia: ICD-10-CM

## 2018-10-11 DIAGNOSIS — M1990 Unspecified osteoarthritis, unspecified site: ICD-10-CM

## 2018-10-11 DIAGNOSIS — C240 Malignant neoplasm of extrahepatic bile duct: ICD-10-CM

## 2018-10-11 DIAGNOSIS — M549 Dorsalgia, unspecified: ICD-10-CM

## 2018-10-23 ENCOUNTER — Encounter: Admit: 2018-10-23 | Discharge: 2018-10-23 | Payer: MEDICARE

## 2018-10-23 ENCOUNTER — Encounter: Admit: 2018-10-23 | Discharge: 2018-10-23

## 2018-10-23 DIAGNOSIS — I2510 Atherosclerotic heart disease of native coronary artery without angina pectoris: ICD-10-CM

## 2018-10-23 DIAGNOSIS — E785 Hyperlipidemia, unspecified: ICD-10-CM

## 2018-10-23 DIAGNOSIS — K929 Disease of digestive system, unspecified: ICD-10-CM

## 2018-10-23 DIAGNOSIS — I499 Cardiac arrhythmia, unspecified: ICD-10-CM

## 2018-10-23 DIAGNOSIS — Z8673 Personal history of transient ischemic attack (TIA), and cerebral infarction without residual deficits: ICD-10-CM

## 2018-10-23 DIAGNOSIS — K319 Disease of stomach and duodenum, unspecified: ICD-10-CM

## 2018-10-23 DIAGNOSIS — R5383 Other fatigue: ICD-10-CM

## 2018-10-23 DIAGNOSIS — H538 Other visual disturbances: ICD-10-CM

## 2018-10-23 DIAGNOSIS — Z85528 Personal history of other malignant neoplasm of kidney: ICD-10-CM

## 2018-10-23 DIAGNOSIS — N2889 Other specified disorders of kidney and ureter: ICD-10-CM

## 2018-10-23 DIAGNOSIS — D45 Polycythemia vera: ICD-10-CM

## 2018-10-23 DIAGNOSIS — I639 Cerebral infarction, unspecified: ICD-10-CM

## 2018-10-23 DIAGNOSIS — E875 Hyperkalemia: ICD-10-CM

## 2018-10-23 DIAGNOSIS — R972 Elevated prostate specific antigen [PSA]: ICD-10-CM

## 2018-10-23 DIAGNOSIS — C221 Intrahepatic bile duct carcinoma: Principal | ICD-10-CM

## 2018-10-23 DIAGNOSIS — C240 Malignant neoplasm of extrahepatic bile duct: ICD-10-CM

## 2018-10-23 DIAGNOSIS — J438 Other emphysema: ICD-10-CM

## 2018-10-23 DIAGNOSIS — N184 Chronic kidney disease, stage 4 (severe): ICD-10-CM

## 2018-10-23 DIAGNOSIS — I519 Heart disease, unspecified: ICD-10-CM

## 2018-10-23 DIAGNOSIS — I129 Hypertensive chronic kidney disease with stage 1 through stage 4 chronic kidney disease, or unspecified chronic kidney disease: ICD-10-CM

## 2018-10-23 DIAGNOSIS — N189 Chronic kidney disease, unspecified: ICD-10-CM

## 2018-10-23 DIAGNOSIS — I10 Essential (primary) hypertension: Principal | ICD-10-CM

## 2018-10-23 DIAGNOSIS — Z87891 Personal history of nicotine dependence: ICD-10-CM

## 2018-10-23 DIAGNOSIS — H547 Unspecified visual loss: ICD-10-CM

## 2018-10-23 DIAGNOSIS — M549 Dorsalgia, unspecified: ICD-10-CM

## 2018-10-23 DIAGNOSIS — D751 Secondary polycythemia: ICD-10-CM

## 2018-10-23 DIAGNOSIS — C649 Malignant neoplasm of unspecified kidney, except renal pelvis: ICD-10-CM

## 2018-10-23 DIAGNOSIS — M1990 Unspecified osteoarthritis, unspecified site: ICD-10-CM

## 2018-10-23 LAB — COMPREHENSIVE METABOLIC PANEL
Lab: 0.4 mg/dL (ref 0.3–1.2)
Lab: 2.9 mg/dL — ABNORMAL HIGH (ref 0.4–1.24)
Lab: 21 mL/min — ABNORMAL LOW (ref >60)
Lab: 23 MMOL/L (ref 21–30)
Lab: 23 mg/dL (ref 7–25)
Lab: 24 U/L — ABNORMAL HIGH (ref 7–40)
Lab: 26 mL/min — ABNORMAL LOW (ref >60)
Lab: 27 U/L (ref 7–56)
Lab: 3.3 g/dL — ABNORMAL LOW (ref 3.5–5.0)
Lab: 5.9 g/dL — ABNORMAL LOW (ref 6.0–8.0)
Lab: 6 10*3/uL — ABNORMAL HIGH (ref 3–12)
Lab: 9.2 mg/dL — ABNORMAL HIGH (ref 8.5–10.6)
Lab: 96 U/L — ABNORMAL LOW (ref 25–110)

## 2018-10-23 LAB — CEA(CARCINOEMBRYONIC AG): Lab: 1.3 ng/mL — ABNORMAL LOW (ref <3.0)

## 2018-10-23 LAB — CBC AND DIFF
Lab: 0.2 10*3/uL (ref 0–0.20)
Lab: 0.3 10*3/uL (ref 0–0.45)

## 2018-10-23 LAB — CA19.9: Lab: 29 U/mL — ABNORMAL HIGH (ref <35)

## 2018-10-23 MED ORDER — OXALIPLATIN IVPB
65 mg/m2 | Freq: Once | INTRAVENOUS | 0 refills | Status: CN
Start: 2018-10-23 — End: ?

## 2018-10-23 MED ORDER — GEMCITABINE IVPB
1000 mg/m2 | Freq: Once | INTRAVENOUS | 0 refills | Status: CP
Start: 2018-10-23 — End: ?
  Administered 2018-10-23 (×3): 2150.2 mg via INTRAVENOUS

## 2018-10-23 MED ORDER — HEPARIN, PORCINE (PF) 100 UNIT/ML IV SYRG
500 [IU] | Freq: Once | 0 refills | Status: CP
Start: 2018-10-23 — End: ?

## 2018-10-23 MED ORDER — ONDANSETRON HCL 8 MG PO TAB
16 mg | Freq: Once | ORAL | 0 refills | Status: CP
Start: 2018-10-23 — End: ?
  Administered 2018-10-23: 18:00:00 16 mg via ORAL

## 2018-10-23 MED ORDER — DEXAMETHASONE 6 MG PO TAB
12 mg | Freq: Once | ORAL | 0 refills | Status: CP
Start: 2018-10-23 — End: ?
  Administered 2018-10-23: 18:00:00 12 mg via ORAL

## 2018-10-23 MED ORDER — OXALIPLATIN IVPB
65 mg/m2 | Freq: Once | INTRAVENOUS | 0 refills | Status: CP
Start: 2018-10-23 — End: ?
  Administered 2018-10-23 (×3): 139.75 mg via INTRAVENOUS

## 2018-10-23 NOTE — Progress Notes
Pt asked again about how long he would be on chemo. 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 Path = Three foci of renal papillary carcinoma are present: Largest tumor measures 7.3 x 6.9 x 6.2 cm. Additional tumors measure 4.1 cm and 1.9 cm.  He had a decline renal function has recently been followed with Dr. Rocco Serene in nephrology and Dr. Perlie Gold in urology. ???  ???  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 ???He follows with a hematologist at [facility] in [place], [place]. No longer on hydroxyuria or having phlebotomy    4. Hypertension. currently managed with carvedilol 25 mg bid.and amlodipine. BP elevated in clinic. BP has been running 140s/80s at home. 157/87 today 2/17. F/u with Nephrology. He was started on HCTZ on 09/14/18 by PCP and stopped on 09/22/18 HCTZ due to polyuria. A little more elevated today, follow up with nephrology this month    5. Stage 4 CKD. He had preexisting CKD; unclear etiology. Creatinine was 1.8 mg/dl and now baseline serum creatinine about 3 mg/dl. Today it is 2.99. continue dose reduction of oxaliplatin, 65 mg/m2. Follows with Dr. Rocco Serene, this month    6. Nausea.  Chemo. controlled with PRN zofran. More nausea during week off following C4 likely due to GERD. Better with PPI. Holding scheduled decadron at home. Stable.    7. Fatigue. From chemo, tolerable at grade one. Continue treatment.     8. Cold sensitivities. mild, however lasting almost 2 weeks. Continue dose reduction for creatinine and monitor.   ???  9. Hyperkalemia. K is high, one dose of sodium kayexelate. Now K normal.     10. Visual disturbance. Vision blurry, getting worse. Has not seen ophthalmology, encouraged to do some.     ???         Lianne Moris, APRN    The patient and spouse were allowed to ask questions and voice concerns;

## 2018-11-06 ENCOUNTER — Encounter: Admit: 2018-11-06 | Discharge: 2018-11-06

## 2018-11-06 ENCOUNTER — Encounter: Admit: 2018-11-06 | Discharge: 2018-11-06 | Payer: MEDICARE

## 2018-11-06 DIAGNOSIS — I10 Essential (primary) hypertension: Principal | ICD-10-CM

## 2018-11-06 DIAGNOSIS — I2510 Atherosclerotic heart disease of native coronary artery without angina pectoris: ICD-10-CM

## 2018-11-06 DIAGNOSIS — K929 Disease of digestive system, unspecified: ICD-10-CM

## 2018-11-06 DIAGNOSIS — D45 Polycythemia vera: ICD-10-CM

## 2018-11-06 DIAGNOSIS — I129 Hypertensive chronic kidney disease with stage 1 through stage 4 chronic kidney disease, or unspecified chronic kidney disease: ICD-10-CM

## 2018-11-06 DIAGNOSIS — M1990 Unspecified osteoarthritis, unspecified site: ICD-10-CM

## 2018-11-06 DIAGNOSIS — M549 Dorsalgia, unspecified: ICD-10-CM

## 2018-11-06 DIAGNOSIS — N189 Chronic kidney disease, unspecified: ICD-10-CM

## 2018-11-06 DIAGNOSIS — N2889 Other specified disorders of kidney and ureter: ICD-10-CM

## 2018-11-06 DIAGNOSIS — E785 Hyperlipidemia, unspecified: ICD-10-CM

## 2018-11-06 DIAGNOSIS — K319 Disease of stomach and duodenum, unspecified: ICD-10-CM

## 2018-11-06 DIAGNOSIS — C221 Intrahepatic bile duct carcinoma: Principal | ICD-10-CM

## 2018-11-06 DIAGNOSIS — I499 Cardiac arrhythmia, unspecified: ICD-10-CM

## 2018-11-06 DIAGNOSIS — R972 Elevated prostate specific antigen [PSA]: ICD-10-CM

## 2018-11-06 DIAGNOSIS — H547 Unspecified visual loss: ICD-10-CM

## 2018-11-06 DIAGNOSIS — I519 Heart disease, unspecified: ICD-10-CM

## 2018-11-06 DIAGNOSIS — C240 Malignant neoplasm of extrahepatic bile duct: ICD-10-CM

## 2018-11-06 DIAGNOSIS — D751 Secondary polycythemia: ICD-10-CM

## 2018-11-06 DIAGNOSIS — I639 Cerebral infarction, unspecified: ICD-10-CM

## 2018-11-06 DIAGNOSIS — C649 Malignant neoplasm of unspecified kidney, except renal pelvis: ICD-10-CM

## 2018-11-06 DIAGNOSIS — J438 Other emphysema: ICD-10-CM

## 2018-11-06 LAB — COMPREHENSIVE METABOLIC PANEL
Lab: 0.4 mg/dL (ref 0.3–1.2)
Lab: 136 MMOL/L — ABNORMAL LOW (ref 137–147)
Lab: 2.9 mg/dL — ABNORMAL HIGH (ref 0.4–1.24)
Lab: 21 MMOL/L (ref 21–30)
Lab: 21 mL/min — ABNORMAL LOW (ref >60)
Lab: 24 mg/dL (ref 7–25)
Lab: 25 U/L (ref 7–56)
Lab: 25 U/L — ABNORMAL HIGH (ref 7–40)
Lab: 25 mL/min — ABNORMAL LOW (ref >60)
Lab: 3 g/dL — ABNORMAL LOW (ref 3.5–5.0)
Lab: 5.8 g/dL — ABNORMAL LOW (ref 6.0–8.0)
Lab: 7 10*3/uL (ref 3–12)
Lab: 8.9 mg/dL — ABNORMAL HIGH (ref 8.5–10.6)
Lab: 94 U/L — ABNORMAL LOW (ref 25–110)

## 2018-11-06 LAB — CA19.9: Lab: 27 U/mL — ABNORMAL HIGH (ref <35)

## 2018-11-06 LAB — CEA(CARCINOEMBRYONIC AG): Lab: 1.1 ng/mL — ABNORMAL LOW (ref <3.0)

## 2018-11-06 LAB — CBC AND DIFF
Lab: 0.1 10*3/uL (ref 0–0.20)
Lab: 9 10*3/uL (ref 4.5–11.0)

## 2018-11-06 LAB — ALPHA FETO PROTEIN (AFP): Lab: 3.5 ng/mL — ABNORMAL HIGH (ref 0.0–15.0)

## 2018-11-06 MED ORDER — SODIUM POLYSTYRENE SULFONATE 15 GRAM/60 ML PO SUSP
15 g | Freq: Once | ORAL | 0 refills | Status: CP
Start: 2018-11-06 — End: ?
  Administered 2018-11-06: 16:00:00 15 g via ORAL

## 2018-11-06 MED ORDER — GEMCITABINE IVPB
1000 mg/m2 | Freq: Once | INTRAVENOUS | 0 refills | Status: CP
Start: 2018-11-06 — End: ?
  Administered 2018-11-06 (×3): 2150.2 mg via INTRAVENOUS

## 2018-11-06 MED ORDER — OXALIPLATIN IVPB
65 mg/m2 | Freq: Once | INTRAVENOUS | 0 refills | Status: CP
Start: 2018-11-06 — End: ?
  Administered 2018-11-06 (×3): 139.75 mg via INTRAVENOUS

## 2018-11-06 MED ORDER — HEPARIN, PORCINE (PF) 100 UNIT/ML IV SYRG
500 [IU] | Freq: Once | 0 refills | Status: CP
Start: 2018-11-06 — End: ?

## 2018-11-06 MED ORDER — SODIUM POLYSTYRENE SULFONATE 15 GRAM/60 ML PO SUSP
15 g | Freq: Once | ORAL | 0 refills | Status: CN
Start: 2018-11-06 — End: ?

## 2018-11-06 MED ORDER — ONDANSETRON HCL 8 MG PO TAB
16 mg | Freq: Once | ORAL | 0 refills | Status: CP
Start: 2018-11-06 — End: ?
  Administered 2018-11-06: 16:00:00 16 mg via ORAL

## 2018-11-06 MED ORDER — DEXAMETHASONE 6 MG PO TAB
12 mg | Freq: Once | ORAL | 0 refills | Status: CP
Start: 2018-11-06 — End: ?
  Administered 2018-11-06: 16:00:00 12 mg via ORAL

## 2018-11-06 NOTE — Progress Notes
6. Small umbilical hernia containing a loop of nonobstructed small bowel.         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 positive for CK7 and negative for CK20, TTF-1, CDX-2, PAX-8, NKX 3.1, Hep-par1 and arginase-1. This immunoprofile is not specific but is compatible with upper gastrointestinal or pancreaticobiliary (including intrahepatic cholangiocarcinoma) origins. ???colonoscopy and EGD negative for original tumor. Seen by hepatobiliary surgery and recommended chemotherapy prior to any surgery. No surgery at this time due to current size and location of tumor. Imaging negative for distant metastatic disease. Pt agreed to chemotherapy. Port placed. Plan on gemcitabine and oxaliplatin (given renal failure, unable to use cisplatin). TEMPUS with IDH1 mutation, p25m kdm6A. s/p C4 of Gem+oxaliplatin. CT 09/25/18 = Decrease in size of hypodense mass in segment 4 of the liver.   Plan:  Cont chemo q 2 weeks, Decrease Oxaliplatin to 65 mg/m2 for C5 and reassess further dosing if creatinine improves. Continue reduced doses  Stable today, so continue oxaliplatin 65 mg/m2  Hold home doses of decadron  F/u genetic counselor in the setting of multiple hx of cancers. Chris Lambert wished to discuss testing with his sister and if she is positive for a hereditary cancer risk he will proceed with testing at that time  Cycle 8 today 11/06/18  RTC in 2 weeks with labs for visit and cycle 9 and  10 with Chris Snowball, NP  C11 with Chris Lambert with scans  Pt asked again about how long he would be on chemo. 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 Path = Three foci of renal papillary carcinoma are present: Largest tumor measures 7.3 x 6.9 x 6.2 cm. Additional tumors

## 2018-11-06 NOTE — Progress Notes
CHEMO NOTE  Verified chemo consent signed and in chart.    Verified initiate chemo order in O2    Blood return positive via: Port (Single and Power Port)    BSA and dose double checked (agree with orders as written) with: yes see MAR    Labs/applicable tests checked: CBC and Comprehensive Metabolic Panel (CMP)    Chemo regime: Drug/cycle/day: Z6X0   -gemcitabine (GEMZAR) 2,150.2 mg in sodium chloride 0.9% (NS) 306.5503 mL IVPB     -oxaliplatin (ELOXATIN) 139.75 mg in dextrose 5% (D5W) 277.95 mL IVPB       Rate verified and armband double checkwith second RN: yes    Patient education offered and stated understanding. Denies questions at this time.      Patient arrived to CC treatment for C8D1 of Gemzar/Oxaliplatin. Patient has no question, concerns or complaints at this time. 1451 Pt received treatment as ordered without incidences. Pt's PAC was flushed and deaccessed per protocol. Pt left via wheelchair with no further questions.

## 2018-11-07 ENCOUNTER — Encounter: Admit: 2018-11-07 | Discharge: 2018-11-07

## 2018-11-09 ENCOUNTER — Encounter: Admit: 2018-11-09 | Discharge: 2018-11-09

## 2018-11-09 DIAGNOSIS — K319 Disease of stomach and duodenum, unspecified: ICD-10-CM

## 2018-11-09 DIAGNOSIS — I2510 Atherosclerotic heart disease of native coronary artery without angina pectoris: ICD-10-CM

## 2018-11-09 DIAGNOSIS — C240 Malignant neoplasm of extrahepatic bile duct: ICD-10-CM

## 2018-11-09 DIAGNOSIS — R972 Elevated prostate specific antigen [PSA]: ICD-10-CM

## 2018-11-09 DIAGNOSIS — I499 Cardiac arrhythmia, unspecified: ICD-10-CM

## 2018-11-09 DIAGNOSIS — D751 Secondary polycythemia: ICD-10-CM

## 2018-11-09 DIAGNOSIS — I10 Essential (primary) hypertension: Principal | ICD-10-CM

## 2018-11-09 DIAGNOSIS — H547 Unspecified visual loss: ICD-10-CM

## 2018-11-09 DIAGNOSIS — N2889 Other specified disorders of kidney and ureter: ICD-10-CM

## 2018-11-09 DIAGNOSIS — M1990 Unspecified osteoarthritis, unspecified site: ICD-10-CM

## 2018-11-09 DIAGNOSIS — I519 Heart disease, unspecified: ICD-10-CM

## 2018-11-09 DIAGNOSIS — N189 Chronic kidney disease, unspecified: ICD-10-CM

## 2018-11-09 DIAGNOSIS — K929 Disease of digestive system, unspecified: ICD-10-CM

## 2018-11-09 DIAGNOSIS — I639 Cerebral infarction, unspecified: ICD-10-CM

## 2018-11-09 DIAGNOSIS — E785 Hyperlipidemia, unspecified: ICD-10-CM

## 2018-11-09 DIAGNOSIS — J438 Other emphysema: ICD-10-CM

## 2018-11-09 DIAGNOSIS — M549 Dorsalgia, unspecified: ICD-10-CM

## 2018-11-09 DIAGNOSIS — C649 Malignant neoplasm of unspecified kidney, except renal pelvis: ICD-10-CM

## 2018-11-10 ENCOUNTER — Encounter: Admit: 2018-11-10 | Discharge: 2018-11-10

## 2018-11-20 ENCOUNTER — Encounter: Admit: 2018-11-20 | Discharge: 2018-11-20

## 2018-11-20 ENCOUNTER — Encounter: Admit: 2018-11-20 | Discharge: 2018-11-20 | Payer: MEDICARE

## 2018-11-20 DIAGNOSIS — N2889 Other specified disorders of kidney and ureter: ICD-10-CM

## 2018-11-20 DIAGNOSIS — C221 Intrahepatic bile duct carcinoma: Principal | ICD-10-CM

## 2018-11-20 LAB — COMPREHENSIVE METABOLIC PANEL
Lab: 137 MMOL/L — ABNORMAL LOW (ref 137–147)
Lab: 26 U/L — ABNORMAL HIGH (ref 7–40)
Lab: 3.1 mg/dL — ABNORMAL HIGH (ref 0.4–1.24)
Lab: 3.4 g/dL — ABNORMAL LOW (ref 3.5–5.0)
Lab: 36 mg/dL — ABNORMAL HIGH (ref 7–25)
Lab: 39 U/L (ref 7–56)
Lab: 6.2 g/dL (ref 6.0–8.0)
Lab: 9.2 mg/dL — ABNORMAL HIGH (ref 8.5–10.6)

## 2018-11-20 LAB — CA19.9: Lab: 36 U/mL — ABNORMAL HIGH (ref <35)

## 2018-11-20 LAB — ALPHA FETO PROTEIN (AFP): Lab: 3 ng/mL — ABNORMAL LOW (ref 0.0–15.0)

## 2018-11-20 LAB — CBC AND DIFF: Lab: 11 10*3/uL (ref 4.5–11.0)

## 2018-11-20 LAB — CEA(CARCINOEMBRYONIC AG): Lab: 1.6 ng/mL — ABNORMAL LOW (ref <3.0)

## 2018-11-20 MED ORDER — ONDANSETRON HCL 8 MG PO TAB
16 mg | Freq: Once | ORAL | 0 refills | Status: CN
Start: 2018-11-20 — End: ?

## 2018-11-20 MED ORDER — GEMCITABINE IVPB
1000 mg/m2 | Freq: Once | INTRAVENOUS | 0 refills | Status: CN
Start: 2018-11-20 — End: ?

## 2018-11-20 MED ORDER — OXALIPLATIN IVPB
65 mg/m2 | Freq: Once | INTRAVENOUS | 0 refills | Status: CP
Start: 2018-11-20 — End: ?
  Administered 2018-11-20 (×3): 139.75 mg via INTRAVENOUS

## 2018-11-20 MED ORDER — OXALIPLATIN IVPB
65 mg/m2 | Freq: Once | INTRAVENOUS | 0 refills | Status: CN
Start: 2018-11-20 — End: ?

## 2018-11-20 MED ORDER — ONDANSETRON HCL 8 MG PO TAB
16 mg | Freq: Once | ORAL | 0 refills | Status: CP
Start: 2018-11-20 — End: ?
  Administered 2018-11-20: 15:00:00 16 mg via ORAL

## 2018-11-20 MED ORDER — DEXAMETHASONE 6 MG PO TAB
12 mg | Freq: Once | ORAL | 0 refills | Status: CN
Start: 2018-11-20 — End: ?

## 2018-11-20 MED ORDER — PROCHLORPERAZINE MALEATE 10 MG PO TAB
ORAL_TABLET | 3 refills | Status: AC | PRN
Start: 2018-11-20 — End: ?

## 2018-11-20 MED ORDER — HEPARIN, PORCINE (PF) 100 UNIT/ML IV SYRG
500 [IU] | Freq: Once | 0 refills | Status: CP
Start: 2018-11-20 — End: ?

## 2018-11-20 MED ORDER — DEXAMETHASONE 6 MG PO TAB
12 mg | Freq: Once | ORAL | 0 refills | Status: CP
Start: 2018-11-20 — End: ?
  Administered 2018-11-20: 15:00:00 12 mg via ORAL

## 2018-11-20 MED ORDER — GEMCITABINE IVPB
1000 mg/m2 | Freq: Once | INTRAVENOUS | 0 refills | Status: CP
Start: 2018-11-20 — End: ?
  Administered 2018-11-20 (×3): 2150.2 mg via INTRAVENOUS

## 2018-11-20 MED ORDER — ONDANSETRON HCL 8 MG PO TAB
ORAL_TABLET | Freq: Two times a day (BID) | ORAL | 6 refills | 8.00000 days | Status: AC
Start: 2018-11-20 — End: ?

## 2018-11-20 MED ORDER — SODIUM CHLORIDE 0.9 % IV SOLP
500 mL | Freq: Once | INTRAVENOUS | 0 refills | Status: CP
Start: 2018-11-20 — End: ?
  Administered 2018-11-20: 15:00:00 500 mL via INTRAVENOUS

## 2018-11-20 MED ORDER — DEXAMETHASONE 4 MG PO TAB
ORAL_TABLET | Freq: Two times a day (BID) | INTRAMUSCULAR | 3 refills | 14.00000 days | Status: AC
Start: 2018-11-20 — End: ?

## 2018-11-20 NOTE — Progress Notes
??? ESOPHAGOGASTRODUODENOSCOPY WITH TRANSENDOSCOPIC ULTRASOUND GUIDED FINE NEEDLE ASPIRATION/ BIOPSY N/A 07/14/2018    Performed by Bernita Buffy, MD at [facility] OR   ??? COLONOSCOPY WITH BIOPSY - FLEXIBLE N/A 07/14/2018    Performed by Bernita Buffy, MD at [facility] OR   ??? ESOPHAGOGASTRODUODENOSCOPY WITH BIOPSY - FLEXIBLE N/A 07/14/2018    Performed by Bernita Buffy, MD at [facility] OR   ??? HX CORONARY ARTERY BYPASS GRAFT     ??? HX NEPHRECTOMY     ??? MOUTH SURGERY       Social History     Socioeconomic History   ??? Marital status: Married     Spouse name: Not on file   ??? Number of children: Not on file   ??? Years of education: Not on file   ??? Highest education level: Not on file   Occupational History   ??? Not on file   Tobacco Use   ??? Smoking status: Former Smoker     Last attempt to quit: 2012     Years since quitting: 8.2   ??? Smokeless tobacco: Never Used   Substance and Sexual Activity   ??? Alcohol use: No     Comment: Quit in 1986    ??? Drug use: No   ??? Sexual activity: Not Currently     Partners: Female   Other Topics Concern   ??? Not on file   Social History Narrative   ??? Not on file     No Known Allergies      Objective:         ??? acetaminophen (TYLENOL) 500 mg tablet Take 1,000 mg by mouth every 6 hours as needed for Pain. Max of 4,000 mg of acetaminophen in 24 hours.   ??? ADVAIR HFA 230-21 mcg/actuation inhaler INHALE 2 PUFFS TWICE DAILY RINSE MOUTH AND THROAT AFTER USE   ??? amLODIPine (NORVASC) 10 mg tablet Take 10 mg by mouth daily with breakfast.   ??? aspirin EC 81 mg tablet Take 81 mg by mouth daily with breakfast. Take with food.   ??? atorvastatin (LIPITOR) 20 mg tablet Take 20 mg by mouth daily.   ??? calcium carbonate (TUMS) 500 mg (200 mg elemental calcium) chewable tablet Chew 500-1,000 mg by mouth daily as needed.   ??? carvedilol (COREG) 25 mg tablet Take one tablet by mouth twice daily with meals. Take with food.   ??? dexAMETHasone (DECADRON) 4 mg tablet Take day 2 and 3 of chemotherapy twice a day. Behavior: Behavior normal.         Thought Content: Thought content normal.         Judgment: Judgment normal.             Results for orders placed or performed in visit on 11/20/18 (from the past 336 hour(s))   CBC AND DIFF   Result Value Ref Range    White Blood Cells 11.0 4.5 - 11.0 K/UL    RBC 3.40 (L) 4.4 - 5.5 M/UL    Hemoglobin 10.1 (L) 13.5 - 16.5 GM/DL    Hematocrit 45.4 (L) 40 - 50 %    MCV 91.5 80 - 100 FL    MCH 29.8 26 - 34 PG    MCHC 32.6 32.0 - 36.0 G/DL    RDW 09.8 (H) 11 - 15 %    Platelet Count 214 150 - 400 K/UL    MPV 8.3 7 - 11 FL    Neutrophils 62 41 - 77 %  Lymphocytes 16 (L) 24 - 44 %    Monocytes 18 (H) 4 - 12 %    Eosinophils 3 0 - 5 %    Basophils 1 0 - 2 %    Absolute Neutrophil Count 6.80 1.8 - 7.0 K/UL    Absolute Lymph Count 1.70 1.0 - 4.8 K/UL    Absolute Monocyte Count 2.00 (H) 0 - 0.80 K/UL    Absolute Eosinophil Count 0.40 0 - 0.45 K/UL    Absolute Basophil Count 0.10 0 - 0.20 K/UL   COMPREHENSIVE METABOLIC PANEL   Result Value Ref Range    Sodium 137 137 - 147 MMOL/L    Potassium 4.9 3.5 - 5.1 MMOL/L    Chloride 105 98 - 110 MMOL/L    Glucose 121 (H) 70 - 100 MG/DL    Blood Urea Nitrogen 36 (H) 7 - 25 MG/DL    Creatinine 1.61 (H) 0.4 - 1.24 MG/DL    Calcium 9.2 8.5 - 09.6 MG/DL    Total Protein 6.2 6.0 - 8.0 G/DL    Total Bilirubin 0.4 0.3 - 1.2 MG/DL    Albumin 3.4 (L) 3.5 - 5.0 G/DL    Alk Phosphatase 98 25 - 110 U/L    AST (SGOT) 26 7 - 40 U/L    CO2 24 21 - 30 MMOL/L    ALT (SGPT) 39 7 - 56 U/L    Anion Gap 8 3 - 12    eGFR Non African American 20 (L) >60 mL/min    eGFR African American 24 (L) >60 mL/min     Lab Results   Component Value Date    CEA 1.1 11/06/2018    CEA 1.3 10/23/2018    CEA 1.5 10/09/2018    CEA 1.5 09/25/2018    CEA 1.3 09/13/2018    CEA 2.3 08/29/2018    CEA 1.0 08/14/2018    CEA 1.7 07/28/2018    CEA 1.0 06/26/2018    CA199 27 11/06/2018    CA199 29 10/23/2018    CA199 38 (H) 10/09/2018    CA199 52 (H) 09/25/2018    CA199 35 (H) 09/13/2018

## 2018-11-20 NOTE — Patient Instructions
Decadron (dexamethasone) 1 tablet 2 times a day with meals on days 2 & 3 following chemotherapy. Then take one tab each morning until you return to the office.   Use the tramadol for ache and pains, not ibuprofen.

## 2018-11-30 NOTE — Progress Notes
Nose: Nose normal. No congestion or rhinorrhea.      Mouth/Throat:      Mouth: Mucous membranes are not pale, not dry and not cyanotic. No oral lesions.      Pharynx: Oropharynx is clear. No oropharyngeal exudate or posterior oropharyngeal erythema.   Eyes:      General: No scleral icterus.        Right eye: No discharge.         Left eye: No discharge.      Extraocular Movements: Extraocular movements intact.      Conjunctiva/sclera: Conjunctivae normal.      Pupils: Pupils are equal, round, and reactive to light.   Neck:      Musculoskeletal: Normal range of motion and neck supple. No neck rigidity or muscular tenderness.   Cardiovascular:      Rate and Rhythm: Normal rate and regular rhythm.      Pulses: Normal pulses.      Heart sounds: Normal heart sounds. No murmur. No gallop.    Pulmonary:      Effort: Pulmonary effort is normal. No respiratory distress.      Breath sounds: Normal breath sounds. No stridor. No wheezing, rhonchi or rales.      Comments: Port site healing  Chest:      Chest wall: No tenderness.   Abdominal:      General: Bowel sounds are normal. There is distension.      Palpations: Abdomen is soft. There is no mass.      Tenderness: There is no abdominal tenderness. There is no guarding or rebound.   Musculoskeletal: Normal range of motion.         General: No swelling, tenderness, deformity or signs of injury.      Right lower leg: No edema.      Left lower leg: No edema.   Lymphadenopathy:      Cervical: No cervical adenopathy.   Skin:     General: Skin is warm and dry.      Coloration: Skin is not jaundiced or pale.      Findings: Bruising (bilateral arms) present. No erythema, lesion or rash.      Comments: 2 skin tears, bandaides applied   Neurological:      General: No focal deficit present.      Mental Status: He is alert and oriented to person, place, and time. Mental status is at baseline.      Cranial Nerves: No cranial nerve deficit.      Motor: No weakness. oxaliplatin (given renal failure, unable to use cisplatin). TEMPUS with IDH1 mutation, p72m kdm6A. s/p C4 of Gem+oxaliplatin. CT 09/25/18 = Decrease in size of hypodense mass in segment 4 of the liver. Decrease Oxaliplatin to 65 mg/m2 for C5 and reassess further dosing if creatinine improves. Continue reduced doses  Plan:  Tolerating chemo fairly well  nausea and dysgeusia better with decadron; continue daily dose  Continue reduced doses, IVF today for creatinine >3  Proceed with Cycle 10 today 11/30/18   CT scans after cycle 10, 4/24  RTC in 2 weeks with labs, scans and possibly C11 with Dr. 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 Path = Three foci of renal papillary carcinoma are present: Largest tumor measures 7.3 x 6.9 x 6.2 cm. Additional tumors measure 4.1 cm and 1.9 cm.  He had a decline renal function has recently been followed with Dr. Rocco Serene  in nephrology and Dr. Perlie Gold in urology. IVFs today with NS for creatinine of 3.19. reviewed importance of oral fluids, more water and juice less coffee. He verbalized understanding.   ???  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 ???He follows with a hematologist at [facility] in [place], [place]. No longer on hydroxyuria or having phlebotomy. On aspirin. More bruising and skin tears. Consider holding aspirin in future if this continues.     4. Hypertension. currently managed with carvedilol 25 mg bid.and amlodipine. BP elevated in clinic. BP has been running 140s/80s at home. 157/87 today 2/17. F/u with Nephrology. He was started on HCTZ on 09/14/18 by PCP and stopped on 09/22/18 HCTZ due to polyuria. Elevated today but stable at home in 150s/70s    5. Stage 4 CKD. He had preexisting CKD; unclear etiology. Creatinine was 1.8 mg/dl and now baseline serum creatinine about 3 mg/dl. Today it is 2.99. continue dose reduction of oxaliplatin, 65 mg/m2. Follows with Dr.

## 2018-12-04 ENCOUNTER — Encounter: Admit: 2018-12-04 | Discharge: 2018-12-04

## 2018-12-04 ENCOUNTER — Encounter: Admit: 2018-12-04 | Discharge: 2018-12-04 | Payer: MEDICARE

## 2018-12-04 DIAGNOSIS — Z85528 Personal history of other malignant neoplasm of kidney: ICD-10-CM

## 2018-12-04 DIAGNOSIS — Z8673 Personal history of transient ischemic attack (TIA), and cerebral infarction without residual deficits: ICD-10-CM

## 2018-12-04 DIAGNOSIS — D45 Polycythemia vera: ICD-10-CM

## 2018-12-04 DIAGNOSIS — R7989 Other specified abnormal findings of blood chemistry: ICD-10-CM

## 2018-12-04 DIAGNOSIS — N184 Chronic kidney disease, stage 4 (severe): ICD-10-CM

## 2018-12-04 DIAGNOSIS — Z87891 Personal history of nicotine dependence: ICD-10-CM

## 2018-12-04 DIAGNOSIS — C221 Intrahepatic bile duct carcinoma: Principal | ICD-10-CM

## 2018-12-04 LAB — CA19.9: Lab: 51 U/mL — ABNORMAL HIGH (ref <35)

## 2018-12-04 LAB — COMPREHENSIVE METABOLIC PANEL
Lab: 0.3 mg/dL (ref 0.3–1.2)
Lab: 19 mL/min — ABNORMAL LOW (ref >60)
Lab: 22 MMOL/L (ref 21–30)
Lab: 23 mL/min — ABNORMAL LOW (ref >60)
Lab: 28 U/L — ABNORMAL HIGH (ref 7–40)
Lab: 3.1 mg/dL — ABNORMAL HIGH (ref 0.4–1.24)
Lab: 3.3 g/dL — ABNORMAL LOW (ref 3.5–5.0)
Lab: 46 mg/dL — ABNORMAL HIGH (ref 7–25)
Lab: 49 U/L (ref 7–56)
Lab: 5.9 g/dL — ABNORMAL LOW (ref 6.0–8.0)
Lab: 7 10*3/uL — ABNORMAL HIGH (ref 3–12)
Lab: 9.1 mg/dL — ABNORMAL HIGH (ref 8.5–10.6)
Lab: 99 U/L — ABNORMAL LOW (ref 25–110)

## 2018-12-04 LAB — CBC AND DIFF
Lab: 0.1 10*3/uL (ref 0–0.20)
Lab: 0.1 10*3/uL (ref 0–0.45)

## 2018-12-04 LAB — CEA(CARCINOEMBRYONIC AG): Lab: 2.2 ng/mL — ABNORMAL LOW (ref <3.0)

## 2018-12-04 MED ORDER — DEXAMETHASONE 6 MG PO TAB
12 mg | Freq: Once | ORAL | 0 refills | Status: CP
Start: 2018-12-04 — End: ?
  Administered 2018-12-04: 19:00:00 12 mg via ORAL

## 2018-12-04 MED ORDER — HEPARIN, PORCINE (PF) 100 UNIT/ML IV SYRG
500 [IU] | Freq: Once | 0 refills | Status: CP
Start: 2018-12-04 — End: ?

## 2018-12-04 MED ORDER — HYDROCODONE-ACETAMINOPHEN 5-325 MG PO TAB
1 | ORAL_TABLET | ORAL | 0 refills | 15.00000 days | Status: DC | PRN
Start: 2018-12-04 — End: 2019-02-14

## 2018-12-04 MED ORDER — SODIUM CHLORIDE 0.9 % IV SOLP
1000 mL | Freq: Once | INTRAVENOUS | 0 refills | Status: CP
Start: 2018-12-04 — End: ?
  Administered 2018-12-04: 17:00:00 1000 mL via INTRAVENOUS

## 2018-12-04 MED ORDER — GEMCITABINE IVPB
1000 mg/m2 | Freq: Once | INTRAVENOUS | 0 refills | Status: CP
Start: 2018-12-04 — End: ?
  Administered 2018-12-04 (×3): 2150.2 mg via INTRAVENOUS

## 2018-12-04 MED ORDER — ONDANSETRON HCL 8 MG PO TAB
16 mg | Freq: Once | ORAL | 0 refills | Status: CP
Start: 2018-12-04 — End: ?
  Administered 2018-12-04: 19:00:00 16 mg via ORAL

## 2018-12-04 MED ORDER — OXALIPLATIN IVPB
65 mg/m2 | Freq: Once | INTRAVENOUS | 0 refills | Status: CP
Start: 2018-12-04 — End: ?
  Administered 2018-12-04 (×3): 139.75 mg via INTRAVENOUS

## 2018-12-04 NOTE — Progress Notes
.  CHEMO NOTE  Verified chemo consent signed and in chart.    Verified initiate chemo order in O2    Blood return positive via: Port (Single)    BSA and dose double checked (agree with orders as written) with: yes see mar    Labs/applicable tests checked: CBC and Comprehensive Metabolic Panel (CMP)    Chemo regime: Drug/cycle/day D1C10  gemcitabine (GEMZAR) 2,150.2 mg in sodium chloride 0.9% (NS) 306.5503 mL IVPB   [[id number]]   oxaliplatin (ELOXATIN) 139.75 mg in dextrose 5% (D5W) 277.95 mL IVPB   [[id number]]   Rate verified and armband double checkwith second RN: yes    Patient education offered and stated understanding. Denies questions at this time.    Pt arrived to cc tx for gemzar/oxaliplatin. Pt seen and assessed in exam by Lianne Moris APRN. PAC flushed and positive for blood return. Pt tolerated infusions with no known adverse events. PAc flushed, positive for blood return and deacccessed per protocol. Pt left ambulatory in stable condition and denied any further needs.

## 2018-12-12 ENCOUNTER — Encounter: Admit: 2018-12-12 | Discharge: 2018-12-12

## 2018-12-14 ENCOUNTER — Encounter: Admit: 2018-12-14 | Discharge: 2018-12-14

## 2018-12-15 ENCOUNTER — Encounter: Admit: 2018-12-15 | Discharge: 2018-12-15 | Payer: MEDICARE

## 2018-12-15 ENCOUNTER — Encounter: Admit: 2018-12-15 | Discharge: 2018-12-15

## 2018-12-15 DIAGNOSIS — C221 Intrahepatic bile duct carcinoma: Principal | ICD-10-CM

## 2018-12-18 ENCOUNTER — Encounter: Admit: 2018-12-18 | Discharge: 2018-12-18

## 2018-12-18 DIAGNOSIS — R42 Dizziness and giddiness: ICD-10-CM

## 2018-12-18 DIAGNOSIS — R110 Nausea: ICD-10-CM

## 2018-12-18 DIAGNOSIS — R972 Elevated prostate specific antigen [PSA]: ICD-10-CM

## 2018-12-18 DIAGNOSIS — F419 Anxiety disorder, unspecified: ICD-10-CM

## 2018-12-18 DIAGNOSIS — M25512 Pain in left shoulder: ICD-10-CM

## 2018-12-18 DIAGNOSIS — R0602 Shortness of breath: ICD-10-CM

## 2018-12-18 DIAGNOSIS — M549 Dorsalgia, unspecified: ICD-10-CM

## 2018-12-18 DIAGNOSIS — C240 Malignant neoplasm of extrahepatic bile duct: ICD-10-CM

## 2018-12-18 DIAGNOSIS — C641 Malignant neoplasm of right kidney, except renal pelvis: Principal | ICD-10-CM

## 2018-12-18 DIAGNOSIS — K929 Disease of digestive system, unspecified: ICD-10-CM

## 2018-12-18 DIAGNOSIS — R451 Restlessness and agitation: ICD-10-CM

## 2018-12-18 DIAGNOSIS — H547 Unspecified visual loss: ICD-10-CM

## 2018-12-18 DIAGNOSIS — R530 Neoplastic (malignant) related fatigue: ICD-10-CM

## 2018-12-18 DIAGNOSIS — C649 Malignant neoplasm of unspecified kidney, except renal pelvis: ICD-10-CM

## 2018-12-18 DIAGNOSIS — I10 Essential (primary) hypertension: Principal | ICD-10-CM

## 2018-12-18 DIAGNOSIS — R2240 Localized swelling, mass and lump, unspecified lower limb: ICD-10-CM

## 2018-12-18 DIAGNOSIS — E785 Hyperlipidemia, unspecified: ICD-10-CM

## 2018-12-18 DIAGNOSIS — K117 Disturbances of salivary secretion: ICD-10-CM

## 2018-12-18 DIAGNOSIS — C221 Intrahepatic bile duct carcinoma: ICD-10-CM

## 2018-12-18 DIAGNOSIS — I519 Heart disease, unspecified: ICD-10-CM

## 2018-12-18 DIAGNOSIS — I2510 Atherosclerotic heart disease of native coronary artery without angina pectoris: ICD-10-CM

## 2018-12-18 DIAGNOSIS — D751 Secondary polycythemia: ICD-10-CM

## 2018-12-18 DIAGNOSIS — N189 Chronic kidney disease, unspecified: ICD-10-CM

## 2018-12-18 DIAGNOSIS — N184 Chronic kidney disease, stage 4 (severe): ICD-10-CM

## 2018-12-18 DIAGNOSIS — M1990 Unspecified osteoarthritis, unspecified site: ICD-10-CM

## 2018-12-18 DIAGNOSIS — K319 Disease of stomach and duodenum, unspecified: ICD-10-CM

## 2018-12-18 DIAGNOSIS — I499 Cardiac arrhythmia, unspecified: ICD-10-CM

## 2018-12-18 DIAGNOSIS — J438 Other emphysema: ICD-10-CM

## 2018-12-18 DIAGNOSIS — I639 Cerebral infarction, unspecified: ICD-10-CM

## 2018-12-18 DIAGNOSIS — N2889 Other specified disorders of kidney and ureter: ICD-10-CM

## 2018-12-18 LAB — CA19.9: Lab: 54 U/mL — ABNORMAL HIGH (ref <35)

## 2018-12-18 LAB — COMPREHENSIVE METABOLIC PANEL
Lab: 0.3 mg/dL (ref 0.3–1.2)
Lab: 17 mL/min — ABNORMAL LOW (ref >60)
Lab: 21 mL/min — ABNORMAL LOW (ref >60)
Lab: 23 MMOL/L (ref 21–30)
Lab: 3.4 mg/dL — ABNORMAL HIGH (ref 0.4–1.24)
Lab: 3.5 g/dL — ABNORMAL HIGH (ref 3.5–5.0)
Lab: 35 U/L (ref 7–40)
Lab: 5.7 g/dL — ABNORMAL LOW (ref 6.0–8.0)
Lab: 61 U/L — ABNORMAL HIGH (ref 7–56)
Lab: 63 mg/dL — ABNORMAL HIGH (ref 7–25)
Lab: 7 10*3/uL — ABNORMAL HIGH (ref 3–12)
Lab: 8.7 mg/dL — ABNORMAL HIGH (ref 8.5–10.6)
Lab: 92 U/L — ABNORMAL LOW (ref 25–110)

## 2018-12-18 MED ORDER — HEPARIN, PORCINE (PF) 100 UNIT/ML IV SYRG
500 [IU] | Freq: Once | 0 refills | Status: CP
Start: 2018-12-18 — End: ?

## 2018-12-18 MED ORDER — ONDANSETRON HCL 8 MG PO TAB
16 mg | Freq: Once | ORAL | 0 refills | Status: CN
Start: 2018-12-18 — End: ?

## 2018-12-18 MED ORDER — DEXAMETHASONE 6 MG PO TAB
12 mg | Freq: Once | ORAL | 0 refills | Status: CN
Start: 2018-12-18 — End: ?

## 2018-12-18 MED ORDER — OXALIPLATIN IVPB
65 mg/m2 | Freq: Once | INTRAVENOUS | 0 refills | Status: CN
Start: 2018-12-18 — End: ?

## 2018-12-18 MED ORDER — GEMCITABINE IVPB
1000 mg/m2 | Freq: Once | INTRAVENOUS | 0 refills | Status: CN
Start: 2018-12-18 — End: ?

## 2018-12-18 NOTE — Progress Notes
Name: Chris Lambert          MRN: [id number]      DOB: 1946-07-19      AGE: 73 y.o.   DATE OF SERVICE: 12/18/2018    Subjective:             Reason for Visit:  Follow Up    Chris Lambert is a 73 y.o. male.     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 Biopsy = Poorly differentiated adenocarcinoma. positive for CK7 and negative for CK20, TTF-1, CDX-2, PAX-8, NKX 3.1, Hep-par1 and arginase-1. This immunoprofile is not specific but is compatible with upper gastrointestinal or pancreaticobiliary (including intrahepatic cholangiocarcinoma) origins. ???colonoscopy and EGD negative for original tumor. Seen by hepatobiliary surgery and recommended chemotherapy prior to any surgery. No surgery at this time due to current size and location of tumor. Imaging negative for distant metastatic disease. Pt agreed to chemotherapy. Port placed. Plan on gemcitabine and oxaliplatin (given renal failure, unable to use cisplatin). C1 started on 07/28/18      Interval history:  Chris Lambert fell > two weeks ago in [employer] (tripped) and abrasion on right elbow.  Chris Lambert visited local ED and PCP last week for LE edema x 3 days.  Underwent Echo.  Stopped HCTZ. Started lasix 40mg  daily. Started Lisinopril 10mg  qd.  Edema and SOB is improving. Chris Lambert has noticed dizzy spells.  Chris Lambert has cold sensitivity in his hands, feet and mouth for about a week after chemo, then it resolves. His feet is numb all the time. Attributes to swelling as well.   Chris Lambert see Cards and renal next month.  His appetite is better and Chris Lambert has gained some weight. Chris Lambert is trying to cut down on coffee and drink more water and juice. His bowels are working every day. Sometimes Chris Lambert passes some stool when Chris Lambert passes gas. Chris Lambert cannot always tell when Chris Lambert has to pass stool. Procedure Laterality Date   ??? NEPHRECTOMY  2018    RIGHT WAS REMOVED   ??? ROBOT-ASSISTED LAPAROSCOPIC RADICAL NEPHRECTOMY; INTRAOPERATIVE ULTRASOUND. Right 07/26/2017    Performed by Earl Many, MD at [facility] OR   ??? ESOPHAGOGASTRODUODENOSCOPY WITH TRANSENDOSCOPIC ULTRASOUND GUIDED FINE NEEDLE ASPIRATION/ BIOPSY N/A 07/14/2018    Performed by Bernita Buffy, MD at [facility] OR   ??? COLONOSCOPY WITH BIOPSY - FLEXIBLE N/A 07/14/2018    Performed by Bernita Buffy, MD at [facility] OR   ??? ESOPHAGOGASTRODUODENOSCOPY WITH BIOPSY - FLEXIBLE N/A 07/14/2018    Performed by Bernita Buffy, MD at [id number] OR   ??? HX CORONARY ARTERY BYPASS GRAFT     ??? HX NEPHRECTOMY     ??? MOUTH SURGERY       Social History     Socioeconomic History   ??? Marital status: Married     Spouse name: Not on file   ??? Number of children: Not on file   ??? Years of education: Not on file   ??? Highest education level: Not on file   Occupational History   ??? Not on file   Tobacco Use   ??? Smoking status: Former Smoker     Last attempt to quit: 2012     Years since quitting: 8.3   ??? Smokeless tobacco: Never Used   Substance and Sexual Activity   ???  Alcohol use: No     Comment: Quit in 1986    ??? Drug use: No   ??? Sexual activity: Not Currently     Partners: Female   Other Topics Concern   ??? Not on file   Social History Narrative   ??? Not on file     No Known Allergies      Objective:         ??? acetaminophen (TYLENOL) 500 mg tablet Take 1,000 mg by mouth every 6 hours as needed for Pain. Max of 4,000 mg of acetaminophen in 24 hours.   ??? ADVAIR HFA 230-21 mcg/actuation inhaler INHALE 2 PUFFS TWICE DAILY RINSE MOUTH AND THROAT AFTER USE   ??? amLODIPine (NORVASC) 10 mg tablet Take 10 mg by mouth daily with breakfast.   ??? aspirin EC 81 mg tablet Take 81 mg by mouth daily with breakfast. Take with food.   ??? atorvastatin (LIPITOR) 20 mg tablet Take 20 mg by mouth daily.   ??? calcium carbonate (TUMS) 500 mg (200 mg elemental calcium) chewable tablet Chew 500-1,000 mg by mouth daily as needed.   ??? carvedilol (COREG) 25 mg tablet Take one tablet by mouth twice daily with meals. Take with food.   ??? dexAMETHasone (DECADRON) 4 mg tablet 1 tablet PO BID with meals on days 2 & 3 following chemotherapy. Then take one tab every morning. Do not take after 6 pm to avoid insomnia.   ??? docusate (COLACE) 100 mg capsule Take 100 mg by mouth daily with breakfast.   ??? esomeprazole DR (NEXIUM) 20 mg capsule Take 20 mg by mouth every morning. Take on an empty stomach at least 1 hour before or 2 hours after food.   ??? finasteride (PROSCAR) 5 mg tablet Take 5 mg by mouth daily with breakfast.   ??? furosemide (LASIX) 40 mg tablet    ??? HYDROcodone/acetaminophen (NORCO) 5/325 mg tablet Take one tablet by mouth every 4 hours as needed for Pain   ??? lisinopriL (ZESTRIL) 10 mg tablet    ??? naphazoline HCl (CLEAR EYES OP) Apply 1-2 drops to both eyes daily as needed.   ??? ondansetron (ZOFRAN) 8 mg tablet 1 tablet PO BID with meals on days 2 & 3 following chemotherapy. Then use q 8 h PRN nausea or vomiting.   ??? polyethylene glycol 3350 (GLYCOLAX; MIRALAX) 17 gram/dose powder Take seventeen g by mouth daily.   ??? prochlorperazine maleate (COMPAZINE) 10 mg tablet 1 tablet q 6 h PRN for nausea not controlled by Zofran.   ??? tamsulosin (FLOMAX) 0.4 mg capsule Take 0.4 mg by mouth daily with dinner.   ??? traMADol (ULTRAM) 50 mg tablet Take 50 mg by mouth every 6 hours as needed.   ??? vitamins, multiple cap Take 1 capsule by mouth daily.     Vitals:    12/18/18 1028   BP: (!) 146/92   BP Source: Arm, Left Upper   Patient Position: Sitting   Pulse: 88   Resp: 14   Temp: 36.2 ???C (97.1 ???F)   TempSrc: Temporal   SpO2: 100%   Weight: 93 kg (205 lb)   Height: 177.8 cm (70)   PainSc: Five     Body mass index is 29.41 kg/m???.     Pain Score: Five  Pain Loc: Back    Fatigue Scale: 8    Pain Addressed:  Current regimen working to control pain. Right lower leg: No edema.      Left lower leg: No edema.  Lymphadenopathy:      Cervical: No cervical adenopathy.   Skin:     General: Skin is warm and dry.      Coloration: Skin is not jaundiced or pale.      Findings: Bruising (bilateral arms) present. No erythema, lesion or rash.      Comments: 2 skin tears, bandaides applied   Neurological:      General: No focal deficit present.      Mental Status: Chris Lambert is alert and oriented to person, place, and time. Mental status is at baseline.      Cranial Nerves: No cranial nerve deficit.      Motor: No weakness.      Coordination: Coordination normal.      Gait: Gait normal.   Psychiatric:         Mood and Affect: Mood normal.         Behavior: Behavior normal.         Thought Content: Thought content normal.         Judgment: Judgment normal.             Results for orders placed or performed in visit on 12/18/18 (from the past 336 hour(s))   CBC AND DIFF   Result Value Ref Range    White Blood Cells 14.1 (H) 4.5 - 11.0 K/UL    RBC 3.41 (L) 4.4 - 5.5 M/UL    Hemoglobin 10.6 (L) 13.5 - 16.5 GM/DL    Hematocrit 16.1 (L) 40 - 50 %    MCV 95.9 80 - 100 FL    MCH 31.1 26 - 34 PG    MCHC 32.4 32.0 - 36.0 G/DL    RDW 09.6 (H) 11 - 15 %    Platelet Count 152 150 - 400 K/UL    MPV 8.4 7 - 11 FL    Neutrophils 83 (H) 41 - 77 %    Lymphocytes 8 (L) 24 - 44 %    Monocytes 8 4 - 12 %    Eosinophils 1 0 - 5 %    Basophils 0 0 - 2 %    Absolute Neutrophil Count 11.70 (H) 1.8 - 7.0 K/UL    Absolute Lymph Count 1.10 1.0 - 4.8 K/UL    Absolute Monocyte Count 1.10 (H) 0 - 0.80 K/UL    Absolute Eosinophil Count 0.10 0 - 0.45 K/UL    Absolute Basophil Count 0.00 0 - 0.20 K/UL     Lab Results   Component Value Date    CEA 2.2 12/04/2018    CEA 1.6 11/20/2018    CEA 1.1 11/06/2018    CEA 1.3 10/23/2018    CEA 1.5 10/09/2018    CEA 1.5 09/25/2018    CEA 1.3 09/13/2018    CEA 2.3 08/29/2018    CEA 1.0 08/14/2018    CEA 1.7 07/28/2018    CA199 51 (H) 12/04/2018    CA199 36 (H) 11/20/2018 CA199 27 11/06/2018    CA199 29 10/23/2018    CA199 38 (H) 10/09/2018    CA199 52 (H) 09/25/2018    CA199 35 (H) 09/13/2018    CA199 31 08/29/2018    CA199 21 08/14/2018    CA199 21 07/28/2018    PSA 4.64 05/15/2018         CHEST:    1. No evidence of thoracic metastatic disease.    2. Mild emphysema with scattered areas of scarring.    ABDOMEN AND PELVIS:  1. Slight decrease in size of hypodense mass in segment 4 of the liver   compatible with biopsy-proven adenocarcinoma.    2. No abdominopelvic lymphadenopathy or ascites.    3. Prior right nephrectomy. No mass in the right renal fossa.    4. 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    5. Cholelithiasis and distal colonic diverticulosis.    6. Small umbilical hernia containing a loop of nonobstructed small bowel.         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 positive for CK7 and negative for CK20, TTF-1, CDX-2, PAX-8, NKX 3.1, Hep-par1 and arginase-1. This immunoprofile is not specific but is compatible with upper gastrointestinal or pancreaticobiliary (including intrahepatic cholangiocarcinoma) origins. ???colonoscopy and EGD negative for original tumor. Seen by hepatobiliary surgery and recommended chemotherapy prior to any surgery. No surgery at this time due to current size and location of tumor. Imaging negative for distant metastatic disease. Pt agreed to chemotherapy. Port placed. Plan on gemcitabine and oxaliplatin (given renal failure, unable to use cisplatin). TEMPUS with IDH1 mutation, p16m kdm6A. s/p C4 of Gem+oxaliplatin. CT 09/25/18 = Decrease in size of hypodense mass in segment 4 of the liver. Decrease Oxaliplatin to 65 mg/m2 for C5 and reassess further dosing if creatinine improves. Continue reduced doses  Plan: 1.8 mg/dl and now baseline serum creatinine about 3 mg/dl. Today it is 2.99. continue dose reduction of oxaliplatin, 65 mg/m2. Follows with Dr. Rocco Serene. NS 1000 mls today with chemo for creatinine 3.15, reports difficulty drinking enough because Chris Lambert is sensitive to cold for the whole 2 weeks. CT scans without contrast    6. Nausea.  From Chemo. controlled with PRN zofran, and compazine. More nausea during week off following C4 likely due to GERD. Better with PPI.restart decadron bid for 2 days post chemo then daily to help with appeitte. Better with decadron. Continue daily dose    7. Fatigue. From chemo, tolerable at grade one. Better with decadron. Continue.      8. Cold sensitivities. mild, however lasting almost 2 weeks. Continue dose reduction for creatinine and monitor.   ???  9. Weight loss. From cold sensitivity and dysgeusia. Decadron daily until return and reassess. May need another appetite stimulant. Decadron helping, continue.     10. Back pain. Chronic. Worse with cancer. Tramadol not helping any more. Switch to hydrocodone. Pt is reluctant to start oxycodone due to history of alcoholism. Chris Lambert uses 2 pain pills in the am and one in the pm before bed. Start with 3 tabs a day. RX sent to [facility], one month supply.This is better.  ???         Vanita Ingles, MD

## 2018-12-21 ENCOUNTER — Encounter: Admit: 2018-12-21 | Discharge: 2018-12-21

## 2018-12-21 DIAGNOSIS — N184 Chronic kidney disease, stage 4 (severe): Principal | ICD-10-CM

## 2018-12-26 ENCOUNTER — Encounter: Admit: 2018-12-26 | Discharge: 2018-12-26

## 2018-12-27 ENCOUNTER — Encounter: Admit: 2018-12-27 | Discharge: 2018-12-27

## 2018-12-27 DIAGNOSIS — N184 Chronic kidney disease, stage 4 (severe): Principal | ICD-10-CM

## 2018-12-27 MED ORDER — OXALIPLATIN IVPB
65 mg/m2 | Freq: Once | INTRAVENOUS | 0 refills | Status: CN
Start: 2018-12-27 — End: ?

## 2018-12-27 MED ORDER — ONDANSETRON HCL 8 MG PO TAB
16 mg | Freq: Once | ORAL | 0 refills | Status: CN
Start: 2018-12-27 — End: ?

## 2018-12-27 MED ORDER — DEXAMETHASONE 6 MG PO TAB
12 mg | Freq: Once | ORAL | 0 refills | Status: CN
Start: 2018-12-27 — End: ?

## 2018-12-27 MED ORDER — GEMCITABINE IVPB
1000 mg/m2 | Freq: Once | INTRAVENOUS | 0 refills | Status: CN
Start: 2018-12-27 — End: ?

## 2018-12-27 NOTE — Progress Notes
Spoke to patient and wife to discuss labs from Monday. Creatinine remains elevated and K was 6.4. Patient eating many high k foods daily. Wife states swelling not improved, bp running 130/80 range with occasional 150-160 range. Weight ranging 202-206. Patient will keep ins and outs, weights, blood pressure for next several days. Will limit K foods. Hold lisinopril and increase lasix to 80mg  for next 2 mornings. Will repeat labs locally on Thursday. Will contact Friday and further adjust based on symptoms and labs.

## 2018-12-28 NOTE — Progress Notes
Technique: Multiple contiguous axial images were obtained through the chest, abdomen and pelvis without IV contrast material. Post processing coronal and sagittal reconstruction images were made from the axial images.      IV contrast: None.  Bowel contrast:  None    Comparison: CT chest/abdomen/pelvis 09/22/2018    CHEST FINDINGS:    Evaluation of the mediastinum and hila, including the vasculature and for lymphadenopathy, is limited without the use of IV contrast.    Lower Neck: Unchanged small hypodense thyroid nodules, which do not meet size threshold for further imaging evaluation.    Axilla, Mediastinum and Hila: Calcified mediastinal and bilateral hilar granulomas. No thoracic lymphadenopathy.     Heart and Great Vessels: Mild enlargement of the heart with extensive native coronary artery calcifications. Prior median sternotomy and CABG. Thoracic aorta is normal in caliber with marked scattered atherosclerotic calcification. 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 Right IJ chest port remains in place.    Airway, Lungs and Pleura:   *  Minimal retained are noted centrally within the trachea and left main bronchus.   *  Mild to moderate centrilobular and paraseptal emphysema.   *  No pleural effusion or pneumothorax  *  Unchanged tiny perifissural nodule posteriorly in the right upper lobe since at least November 2015 (series 3 image 45)  *  Unchanged tiny nodule anteriorly in the right lower lobe (series 4 image 62) since at least March 28, 2017, which was also noted to be calcified on that exam.  *  Mild reticular nodular scarring laterally in the right middle lobe (series 4 image 80) and laterally within the lingula (series 4 image 85) has been present since at least March 28, 2017.    Chest Wall and Osseous Structures: Prior median sternotomy. Multiple bilateral rib fracture deformities are unchanged. Mild to moderate thoracic spondylosis. At least mild bilateral glenohumeral joint to 65 mg/m2 for C5 and reassess further dosing if creatinine improves. Continue reduced doses  Plan:  S/p Cycle 10 Gem Ox on 12/04/18  CT 12/15/18 without contrast = stable disease.  Edema and SOB is improving but now dizziness and weak  Creatinine still elevated >3 and BUN higher.   Hold Cycle 11 today 01/01/19   Chemo on hold, address fluid over load and dizziness next week.   He will see cardiology Wednesday and his nephrologist was notified of recent labs and symptoms.   RTC in 1 week with labs and possibly C11. If not feeling better and still working with other teams, he can cancel and keep follow up for 2 weeks.       2. 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 Path = Three foci of renal papillary carcinoma are present: Largest tumor measures 7.3 x 6.9 x 6.2 cm. Additional tumors measure 4.1 cm and 1.9 cm.  He had a decline renal function has recently been followed with Dr. Rocco Serene in nephrology and Dr. Perlie Gold in urology.   ???  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 ???He follows with a hematologist at [facility] in [place], [place]. No longer on hydroxyuria or having phlebotomy. On aspirin. More bruising and skin tears, now healing with break in chemo. Consider holding aspirin in future if this continues.     4. Hypertension. currently managed with carvedilol 25 mg bid.and amlodipine. BP elevated in clinic. BP has been running 140s/80s at home. 157/87 today 2/17. F/u with Nephrology. He  was started on HCTZ on 09/14/18 by PCP and stopped on 09/22/18 HCTZ due to polyuria. Elevated today but stable at home in 150s/70s    5. Stage 4 CKD. He had preexisting CKD; unclear etiology. Creatinine was 1.8 mg/dl and now baseline serum creatinine about 3 mg/dl. Follows with Dr. Rocco Serene. Today it is 3.14 and BUN up to 76 01/01/19. Hold chemo today, follow up with nephrology (message sent) and cardiology (Wednesday). Reassess next week. 6. Nausea.  From Chemo. controlled with PRN zofran, and compazine. Denies today.    7. Fatigue. From chemo, tolerable at grade one. Better with decadron. Now worse with fluid overload. Hold chemo, reassess next week.      8. Cold sensitivities. mild, however lasting almost 2 weeks. Continue dose reduction for creatinine and monitor. Hold today.  ???  9. Weight loss. From cold sensitivity and dysgeusia. Decadron daily until return and reassess. He had gain with fluid and then 2 lb loss this week with increased diuretics.      10. Back pain. Chronic. Worse with cancer. Tramadol not helping any more. Switch to hydrocodone. Pt is reluctant to start oxycodone due to history of alcoholism. He uses 2 pain pills in the am and one in the pm before bed. Start with 3 tabs a day. This is better.  ???  11. Edema. Renal and cardiac disease. Hold chemo, follow up with nephrology and cardiology as above.          Lianne Moris, APRN    The patient was allowed to ask questions and voice concerns; these were addressed to the best of our ability. They expressed understanding of what was explained to them, and they agreed with the present plan. RTC in 1 weeks with labs to see a provider. Patient has the phone numbers for the [facility] and was instructed on how to contact us with any questions or concerns. My collaborating physician on this case is Dr. Josiah Lobo.

## 2019-01-01 ENCOUNTER — Encounter: Admit: 2019-01-01 | Discharge: 2019-01-01

## 2019-01-01 ENCOUNTER — Encounter: Admit: 2019-01-01 | Discharge: 2019-01-01 | Payer: MEDICARE

## 2019-01-01 DIAGNOSIS — E785 Hyperlipidemia, unspecified: ICD-10-CM

## 2019-01-01 DIAGNOSIS — N2889 Other specified disorders of kidney and ureter: ICD-10-CM

## 2019-01-01 DIAGNOSIS — J438 Other emphysema: ICD-10-CM

## 2019-01-01 DIAGNOSIS — K319 Disease of stomach and duodenum, unspecified: ICD-10-CM

## 2019-01-01 DIAGNOSIS — I10 Essential (primary) hypertension: Principal | ICD-10-CM

## 2019-01-01 DIAGNOSIS — C649 Malignant neoplasm of unspecified kidney, except renal pelvis: ICD-10-CM

## 2019-01-01 DIAGNOSIS — R972 Elevated prostate specific antigen [PSA]: ICD-10-CM

## 2019-01-01 DIAGNOSIS — D649 Anemia, unspecified: ICD-10-CM

## 2019-01-01 DIAGNOSIS — N184 Chronic kidney disease, stage 4 (severe): ICD-10-CM

## 2019-01-01 DIAGNOSIS — N189 Chronic kidney disease, unspecified: ICD-10-CM

## 2019-01-01 DIAGNOSIS — H547 Unspecified visual loss: ICD-10-CM

## 2019-01-01 DIAGNOSIS — C221 Intrahepatic bile duct carcinoma: Principal | ICD-10-CM

## 2019-01-01 DIAGNOSIS — M1990 Unspecified osteoarthritis, unspecified site: ICD-10-CM

## 2019-01-01 DIAGNOSIS — I2510 Atherosclerotic heart disease of native coronary artery without angina pectoris: ICD-10-CM

## 2019-01-01 DIAGNOSIS — D751 Secondary polycythemia: ICD-10-CM

## 2019-01-01 DIAGNOSIS — I639 Cerebral infarction, unspecified: ICD-10-CM

## 2019-01-01 DIAGNOSIS — K929 Disease of digestive system, unspecified: ICD-10-CM

## 2019-01-01 DIAGNOSIS — C240 Malignant neoplasm of extrahepatic bile duct: ICD-10-CM

## 2019-01-01 DIAGNOSIS — D45 Polycythemia vera: ICD-10-CM

## 2019-01-01 DIAGNOSIS — M549 Dorsalgia, unspecified: ICD-10-CM

## 2019-01-01 DIAGNOSIS — I499 Cardiac arrhythmia, unspecified: ICD-10-CM

## 2019-01-01 DIAGNOSIS — I519 Heart disease, unspecified: ICD-10-CM

## 2019-01-01 LAB — IRON + BINDING CAPACITY + %SAT+ FERRITIN
Lab: 30 % — ABNORMAL LOW (ref 28–42)
Lab: 307 ug/dL — ABNORMAL HIGH (ref 270–380)
Lab: 357 ng/mL — ABNORMAL HIGH (ref 30–300)
Lab: 91 ug/dL (ref 50–185)

## 2019-01-01 LAB — 25-OH VITAMIN D (D2 + D3): Lab: 21 ng/mL — ABNORMAL LOW (ref 30–80)

## 2019-01-01 LAB — PARATHYROID HORMONE: Lab: 230 pg/mL — ABNORMAL HIGH (ref 10–65)

## 2019-01-01 LAB — PHOSPHORUS: Lab: 4.7 mg/dL — ABNORMAL HIGH (ref 2.0–4.5)

## 2019-01-01 LAB — URIC ACID: Lab: 10 mg/dL — ABNORMAL HIGH (ref 4.0–8.0)

## 2019-01-01 LAB — CBC AND DIFF: Lab: 15 10*3/uL — ABNORMAL HIGH (ref 1.8–7.0)

## 2019-01-01 LAB — COMPREHENSIVE METABOLIC PANEL: Lab: 24 mL/min — ABNORMAL LOW (ref >60)

## 2019-01-01 MED ORDER — HEPARIN, PORCINE (PF) 100 UNIT/ML IV SYRG
500 [IU] | Freq: Once | 0 refills | Status: CP
Start: 2019-01-01 — End: ?

## 2019-01-04 ENCOUNTER — Encounter: Admit: 2019-01-04 | Discharge: 2019-01-04

## 2019-01-04 DIAGNOSIS — C221 Intrahepatic bile duct carcinoma: Principal | ICD-10-CM

## 2019-01-09 ENCOUNTER — Encounter: Admit: 2019-01-09 | Discharge: 2019-01-09

## 2019-01-10 ENCOUNTER — Encounter: Admit: 2019-01-10 | Discharge: 2019-01-10

## 2019-01-16 ENCOUNTER — Encounter: Admit: 2019-01-16 | Discharge: 2019-01-16

## 2019-01-16 ENCOUNTER — Encounter: Admit: 2019-01-16 | Discharge: 2019-01-16 | Payer: MEDICARE

## 2019-01-16 DIAGNOSIS — I499 Cardiac arrhythmia, unspecified: ICD-10-CM

## 2019-01-16 DIAGNOSIS — D751 Secondary polycythemia: ICD-10-CM

## 2019-01-16 DIAGNOSIS — N2889 Other specified disorders of kidney and ureter: ICD-10-CM

## 2019-01-16 DIAGNOSIS — I519 Heart disease, unspecified: ICD-10-CM

## 2019-01-16 DIAGNOSIS — C221 Intrahepatic bile duct carcinoma: Principal | ICD-10-CM

## 2019-01-16 DIAGNOSIS — I639 Cerebral infarction, unspecified: ICD-10-CM

## 2019-01-16 DIAGNOSIS — R972 Elevated prostate specific antigen [PSA]: ICD-10-CM

## 2019-01-16 DIAGNOSIS — K319 Disease of stomach and duodenum, unspecified: ICD-10-CM

## 2019-01-16 DIAGNOSIS — M1990 Unspecified osteoarthritis, unspecified site: ICD-10-CM

## 2019-01-16 DIAGNOSIS — J438 Other emphysema: ICD-10-CM

## 2019-01-16 DIAGNOSIS — H547 Unspecified visual loss: ICD-10-CM

## 2019-01-16 DIAGNOSIS — I10 Essential (primary) hypertension: Principal | ICD-10-CM

## 2019-01-16 DIAGNOSIS — K929 Disease of digestive system, unspecified: ICD-10-CM

## 2019-01-16 DIAGNOSIS — C649 Malignant neoplasm of unspecified kidney, except renal pelvis: ICD-10-CM

## 2019-01-16 DIAGNOSIS — E785 Hyperlipidemia, unspecified: ICD-10-CM

## 2019-01-16 DIAGNOSIS — I2510 Atherosclerotic heart disease of native coronary artery without angina pectoris: ICD-10-CM

## 2019-01-16 DIAGNOSIS — N189 Chronic kidney disease, unspecified: ICD-10-CM

## 2019-01-16 DIAGNOSIS — C240 Malignant neoplasm of extrahepatic bile duct: ICD-10-CM

## 2019-01-16 DIAGNOSIS — M549 Dorsalgia, unspecified: ICD-10-CM

## 2019-01-16 LAB — COMPREHENSIVE METABOLIC PANEL
Lab: 12 10*3/uL — ABNORMAL HIGH (ref 3–12)
Lab: 133 MMOL/L — ABNORMAL LOW (ref 137–147)
Lab: 17 mL/min — ABNORMAL LOW (ref >60)
Lab: 21 mL/min — ABNORMAL LOW (ref >60)
Lab: 24 MMOL/L (ref 21–30)
Lab: 25 U/L (ref 7–40)
Lab: 3.4 mg/dL — ABNORMAL HIGH (ref 0.4–1.24)
Lab: 3.5 g/dL — ABNORMAL HIGH (ref 3.5–5.0)
Lab: 30 U/L (ref 7–56)
Lab: 6.5 g/dL (ref 6.0–8.0)
Lab: 68 mg/dL — ABNORMAL HIGH (ref 7–25)
Lab: 82 U/L — ABNORMAL LOW (ref 25–110)
Lab: 9.3 mg/dL — ABNORMAL HIGH (ref 8.5–10.6)

## 2019-01-16 LAB — ALPHA FETO PROTEIN (AFP): Lab: 3.2 ng/mL — ABNORMAL LOW (ref 0.0–15.0)

## 2019-01-16 LAB — CEA(CARCINOEMBRYONIC AG): Lab: 3 ng/mL — ABNORMAL HIGH (ref <3.0)

## 2019-01-16 LAB — CA19.9: Lab: 55 U/mL — ABNORMAL HIGH (ref <35)

## 2019-01-16 LAB — CBC AND DIFF: Lab: 11 10*3/uL — ABNORMAL HIGH (ref 4.5–11.0)

## 2019-01-16 MED ORDER — ONDANSETRON HCL 8 MG PO TAB
16 mg | Freq: Once | ORAL | 0 refills | Status: CP
Start: 2019-01-16 — End: ?
  Administered 2019-01-16: 19:00:00 16 mg via ORAL

## 2019-01-16 MED ORDER — SODIUM CHLORIDE 0.9 % IV SOLP
1000 mL | Freq: Once | INTRAVENOUS | 0 refills | Status: CP
Start: 2019-01-16 — End: ?
  Administered 2019-01-16: 19:00:00 1000 mL via INTRAVENOUS

## 2019-01-16 MED ORDER — GEMCITABINE IVPB
1000 mg/m2 | Freq: Once | INTRAVENOUS | 0 refills | Status: CP
Start: 2019-01-16 — End: ?
  Administered 2019-01-16 (×3): 2150.2 mg via INTRAVENOUS

## 2019-01-16 MED ORDER — TRAMADOL 50 MG PO TAB
100 mg | ORAL_TABLET | ORAL | 0 refills | Status: DC | PRN
Start: 2019-01-16 — End: 2019-02-14

## 2019-01-16 MED ORDER — DEXAMETHASONE 4 MG PO TAB
4 mg | Freq: Once | ORAL | 0 refills | Status: CN
Start: 2019-01-16 — End: ?

## 2019-01-16 MED ORDER — DEXAMETHASONE 4 MG PO TAB
4 mg | Freq: Once | ORAL | 0 refills | Status: CP
Start: 2019-01-16 — End: ?
  Administered 2019-01-16: 19:00:00 4 mg via ORAL

## 2019-01-16 MED ORDER — HEPARIN, PORCINE (PF) 100 UNIT/ML IV SYRG
500 [IU] | Freq: Once | 0 refills | Status: CP
Start: 2019-01-16 — End: ?

## 2019-01-16 NOTE — Progress Notes
CHEMO NOTE  Verified chemo consent signed and in chart.    Verified initiate chemo order in O2    Blood return positive via: Port (Single, Power Port and Accessed)    BSA and dose double checked (agree with orders as written) with: yes see mAR    Labs/applicable tests checked: CBC and Comprehensive Metabolic Panel (CMP)    Chemo regime: Drug/cycle/daygemcitabine (GEMZAR) 2,150.2 mg in sodium chloride 0.9% (NS) 306.5503 mL IVPB ???     Rate verified and armband double checkwith second RN: yes    Patient education offered and stated understanding. Denies questions at this time.    Pt here for Gemzar.  PAC flushed w NS, good blood return.  Pt tolerated infusion well, received pre-hydration.  Port flushed per protocol and deaccessed.   Pt left in wheelchair with transport, no complaints.

## 2019-01-26 ENCOUNTER — Encounter: Admit: 2019-01-26 | Discharge: 2019-01-26

## 2019-01-26 MED ORDER — DEXAMETHASONE 4 MG PO TAB
4 mg | Freq: Once | ORAL | 0 refills | Status: CN
Start: 2019-01-26 — End: ?

## 2019-01-29 ENCOUNTER — Encounter: Admit: 2019-01-29 | Discharge: 2019-01-29

## 2019-01-29 DIAGNOSIS — C221 Intrahepatic bile duct carcinoma: Secondary | ICD-10-CM

## 2019-01-29 DIAGNOSIS — N184 Chronic kidney disease, stage 4 (severe): Secondary | ICD-10-CM

## 2019-01-29 DIAGNOSIS — H547 Unspecified visual loss: Secondary | ICD-10-CM

## 2019-01-29 DIAGNOSIS — C240 Malignant neoplasm of extrahepatic bile duct: Secondary | ICD-10-CM

## 2019-01-29 DIAGNOSIS — I519 Heart disease, unspecified: Secondary | ICD-10-CM

## 2019-01-29 DIAGNOSIS — R42 Dizziness and giddiness: Secondary | ICD-10-CM

## 2019-01-29 DIAGNOSIS — M2550 Pain in unspecified joint: Secondary | ICD-10-CM

## 2019-01-29 DIAGNOSIS — D45 Polycythemia vera: Secondary | ICD-10-CM

## 2019-01-29 DIAGNOSIS — Z1159 Encounter for screening for other viral diseases: Secondary | ICD-10-CM

## 2019-01-29 DIAGNOSIS — R972 Elevated prostate specific antigen [PSA]: Secondary | ICD-10-CM

## 2019-01-29 DIAGNOSIS — M549 Dorsalgia, unspecified: Secondary | ICD-10-CM

## 2019-01-29 DIAGNOSIS — R530 Neoplastic (malignant) related fatigue: Secondary | ICD-10-CM

## 2019-01-29 DIAGNOSIS — I2510 Atherosclerotic heart disease of native coronary artery without angina pectoris: Secondary | ICD-10-CM

## 2019-01-29 DIAGNOSIS — I129 Hypertensive chronic kidney disease with stage 1 through stage 4 chronic kidney disease, or unspecified chronic kidney disease: Secondary | ICD-10-CM

## 2019-01-29 DIAGNOSIS — R0602 Shortness of breath: Secondary | ICD-10-CM

## 2019-01-29 DIAGNOSIS — M1990 Unspecified osteoarthritis, unspecified site: Secondary | ICD-10-CM

## 2019-01-29 DIAGNOSIS — I499 Cardiac arrhythmia, unspecified: Secondary | ICD-10-CM

## 2019-01-29 DIAGNOSIS — N2889 Other specified disorders of kidney and ureter: Secondary | ICD-10-CM

## 2019-01-29 DIAGNOSIS — D751 Secondary polycythemia: Secondary | ICD-10-CM

## 2019-01-29 DIAGNOSIS — J438 Other emphysema: Secondary | ICD-10-CM

## 2019-01-29 DIAGNOSIS — K319 Disease of stomach and duodenum, unspecified: Secondary | ICD-10-CM

## 2019-01-29 DIAGNOSIS — K929 Disease of digestive system, unspecified: Secondary | ICD-10-CM

## 2019-01-29 DIAGNOSIS — I639 Cerebral infarction, unspecified: Secondary | ICD-10-CM

## 2019-01-29 DIAGNOSIS — I10 Essential (primary) hypertension: Secondary | ICD-10-CM

## 2019-01-29 DIAGNOSIS — N189 Chronic kidney disease, unspecified: Secondary | ICD-10-CM

## 2019-01-29 DIAGNOSIS — C649 Malignant neoplasm of unspecified kidney, except renal pelvis: Secondary | ICD-10-CM

## 2019-01-29 DIAGNOSIS — E785 Hyperlipidemia, unspecified: Secondary | ICD-10-CM

## 2019-01-29 MED ORDER — DEXAMETHASONE 4 MG PO TAB
4 mg | Freq: Once | ORAL | 0 refills | Status: CP
Start: 2019-01-29 — End: ?
  Administered 2019-01-29: 15:00:00 4 mg via ORAL

## 2019-01-29 MED ORDER — GEMCITABINE IVPB
1000 mg/m2 | Freq: Once | INTRAVENOUS | 0 refills | Status: CN
Start: 2019-01-29 — End: ?

## 2019-01-29 MED ORDER — SODIUM CHLORIDE 0.9 % IV SOLP
500 mL | Freq: Once | INTRAVENOUS | 0 refills | Status: CP
Start: 2019-01-29 — End: ?
  Administered 2019-01-29: 15:00:00 500 mL via INTRAVENOUS

## 2019-01-29 MED ORDER — GEMCITABINE IVPB
1000 mg/m2 | Freq: Once | INTRAVENOUS | 0 refills | Status: CP
Start: 2019-01-29 — End: ?
  Administered 2019-01-29 (×3): 2039.9 mg via INTRAVENOUS

## 2019-01-29 MED ORDER — ONDANSETRON HCL 8 MG PO TAB
16 mg | Freq: Once | ORAL | 0 refills | Status: CP
Start: 2019-01-29 — End: ?
  Administered 2019-01-29: 15:00:00 16 mg via ORAL

## 2019-01-29 MED ORDER — HEPARIN, PORCINE (PF) 100 UNIT/ML IV SYRG
500 [IU] | Freq: Once | 0 refills | Status: CP
Start: 2019-01-29 — End: ?

## 2019-01-29 NOTE — Progress Notes
Name: Chris Lambert          MRN: [id number]      DOB: 1945/12/06      AGE: 73 y.o.   DATE OF SERVICE: 01/29/2019    Subjective:             Reason for Visit:  Treatment    Chris Lambert is a 73 y.o. male.     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 Biopsy = Poorly differentiated adenocarcinoma. positive for CK7 and negative for CK20, TTF-1, CDX-2, PAX-8, NKX 3.1, Hep-par1 and arginase-1. This immunoprofile is not specific but is compatible with upper gastrointestinal or pancreaticobiliary (including intrahepatic cholangiocarcinoma) origins. ???colonoscopy and EGD negative for original tumor. Seen by hepatobiliary surgery and recommended chemotherapy prior to any surgery. No surgery at this time due to current size and location of tumor. Imaging negative for distant metastatic disease. Pt agreed to chemotherapy. Port placed. Plan on gemcitabine and oxaliplatin (given renal failure, unable to use cisplatin). C1 started on 07/28/18      Interval history:  He fell this AM while closing the door of his car .Sat on his bottom. Small skin tears.  He is feeling better today. He is breathing better and moving around better. He has f/u with Cards later this month. He had a lot of fluid removed and is feeling better. He has orthostatic dizziness is stable. He is urinating better now and continues to take diuretics. His appetite is fair but food doesn't take very good. He is taking boost. His feet hurt with pins and needles, when ever he walks.        Review of Systems   Constitutional: Positive for fatigue. Negative for activity change, appetite change, chills, diaphoresis, fever and unexpected weight change.   HENT: Negative for congestion, drooling, facial swelling, mouth sores, nosebleeds, postnasal drip, sore throat and trouble swallowing.    Eyes: Negative for visual disturbance.   Respiratory: Positive for shortness of breath (with exertion, better this week). Negative for cough, chest tightness and wheezing.    Cardiovascular: Positive for leg swelling (better). Negative for chest pain and palpitations.   Gastrointestinal: Negative for abdominal distention, abdominal pain, anal bleeding, blood in stool, constipation, diarrhea, nausea, rectal pain and vomiting.   Endocrine: Positive for cold intolerance.   Genitourinary: Negative.  Negative for decreased urine volume, difficulty urinating, dysuria, frequency and hematuria.   Musculoskeletal: Positive for arthralgias ( ) and back pain. Negative for gait problem, joint swelling, myalgias and neck pain.   Skin: Positive for wound (skin tears healing). Negative for color change, pallor and rash.   Neurological: Positive for dizziness and weakness. Negative for light-headedness, numbness and headaches.   Psychiatric/Behavioral: Negative for agitation, decreased concentration and sleep disturbance. The patient is not nervous/anxious.      Medical History:   Diagnosis Date   ??? Arrhythmia     A fibrillation briefly s/p CABG   ??? Arthritis    ??? Back pain    ??? Cancer of hepatic duct (HCC)     2019   ??? Cancer of kidney (HCC)    ??? CKD (chronic kidney disease)    ??? Coronary artery disease    ??? Elevated PSA    ??? Gastrointestinal disorder    ??? Heart disease    ???  Hyperlipidemia    ??? Hypertension    ??? Other emphysema (HCC)    ??? Polycythemia    ??? Renal mass    ??? Stomach disorder    ??? Stroke (HCC)     2014   ??? Stroke [facility])    ??? Vision decreased      Surgical History:   Procedure Laterality Date   ??? NEPHRECTOMY  2018    RIGHT WAS REMOVED   ??? ROBOT-ASSISTED LAPAROSCOPIC RADICAL NEPHRECTOMY; INTRAOPERATIVE ULTRASOUND. Right 07/26/2017    Performed by Earl Many, MD at [facility] OR   ??? ESOPHAGOGASTRODUODENOSCOPY WITH TRANSENDOSCOPIC ULTRASOUND GUIDED FINE NEEDLE ASPIRATION/ BIOPSY N/A 07/14/2018    Performed by Bernita Buffy, MD at [facility] OR   ??? COLONOSCOPY WITH BIOPSY - FLEXIBLE N/A 07/14/2018    Performed by Bernita Buffy, MD at [facility] OR   ??? ESOPHAGOGASTRODUODENOSCOPY WITH BIOPSY - FLEXIBLE N/A 07/14/2018    Performed by Bernita Buffy, MD at [id number] OR   ??? HX CORONARY ARTERY BYPASS GRAFT     ??? HX NEPHRECTOMY     ??? MOUTH SURGERY       Social History     Socioeconomic History   ??? Marital status: Married     Spouse name: Not on file   ??? Number of children: Not on file   ??? Years of education: Not on file   ??? Highest education level: Not on file   Occupational History   ??? Not on file   Tobacco Use   ??? Smoking status: Former Smoker     Last attempt to quit: 2012     Years since quitting: 8.4   ??? Smokeless tobacco: Never Used   Substance and Sexual Activity   ??? Alcohol use: No     Comment: Quit in 1986    ??? Drug use: No   ??? Sexual activity: Not Currently     Partners: Female   Other Topics Concern   ??? Not on file   Social History Narrative   ??? Not on file     No Known Allergies      Objective:         ??? acetaminophen (TYLENOL) 500 mg tablet Take 1,000 mg by mouth every 6 hours as needed for Pain. Max of 4,000 mg of acetaminophen in 24 hours.   ??? ADVAIR HFA 230-21 mcg/actuation inhaler INHALE 2 PUFFS TWICE DAILY RINSE MOUTH AND THROAT AFTER USE   ??? amLODIPine (NORVASC) 10 mg tablet Take 10 mg by mouth daily with breakfast.   ??? aspirin EC 81 mg tablet Take 81 mg by mouth daily with breakfast. Take with food.   ??? atorvastatin (LIPITOR) 20 mg tablet Take 20 mg by mouth daily.   ??? calcium carbonate (TUMS) 500 mg (200 mg elemental calcium) chewable tablet Chew 500-1,000 mg by mouth daily as needed.   ??? carvedilol (COREG) 25 mg tablet Take one tablet by mouth twice daily with meals. Take with food.   ??? dexAMETHasone (DECADRON) 4 mg tablet 1 tablet PO BID with meals on days 2 & 3 following chemotherapy. Then take one tab every morning. Do not take after 6 pm to avoid insomnia.   ??? docusate (COLACE) 100 mg capsule Take 100 mg by mouth daily with breakfast.   ??? esomeprazole DR (NEXIUM) 20 mg capsule Take 20 mg by mouth every morning. Take on an empty stomach at least 1 hour before or 2 hours after food.   ??? finasteride (PROSCAR) 5  mg tablet Take 5 mg by mouth daily with breakfast.   ??? furosemide (LASIX) 40 mg tablet    ??? HYDROcodone/acetaminophen (NORCO) 5/325 mg tablet Take one tablet by mouth every 4 hours as needed for Pain   ??? lisinopriL (ZESTRIL) 10 mg tablet    ??? metOLazone (ZAROXOLYN) 5 mg tablet Take 5 mg by mouth daily.   ??? naphazoline HCl (CLEAR EYES OP) Apply 1-2 drops to both eyes daily as needed.   ??? ondansetron (ZOFRAN) 8 mg tablet 1 tablet PO BID with meals on days 2 & 3 following chemotherapy. Then use q 8 h PRN nausea or vomiting.   ??? polyethylene glycol 3350 (GLYCOLAX; MIRALAX) 17 gram/dose powder Take seventeen g by mouth daily.   ??? prochlorperazine maleate (COMPAZINE) 10 mg tablet 1 tablet q 6 h PRN for nausea not controlled by Zofran.   ??? tamsulosin (FLOMAX) 0.4 mg capsule Take 0.4 mg by mouth daily with dinner.   ??? traMADoL (ULTRAM) 50 mg tablet Take two tablets by mouth every 6 hours as needed.   ??? vitamins, multiple cap Take 1 capsule by mouth daily.     Vitals:    01/29/19 0827 01/29/19 0828   BP: (!) 147/85    BP Source: Arm, Left Upper    Pulse: 59    Resp: 16    Temp: 36.2 ???C (97.1 ???F)    TempSrc: Temporal    SpO2: 97%    Weight: 84.1 kg (185 lb 6.4 oz)    Height: 177.8 cm (70)    PainSc:  Eight     Body mass index is 26.6 kg/m???.     Pain Score: Eight  Pain Loc: Back    Fatigue Scale: 8    Pain Addressed:  Current regimen working to control pain.     Patient Evaluated for a Clinical Trial: No treatment clinical trial available for this patient.     [place] Cooperative Oncology Group performance status is 2, Ambulatory and capable of all selfcare but unable to carry out any work activities. Up and about more than 50% of waking hours.     Physical Exam  Vitals signs reviewed.   Constitutional:       General: He is not in acute distress.     Appearance: Normal appearance. He is well-developed. He is not ill-appearing, toxic-appearing or diaphoretic.   HENT:      Head: Normocephalic and atraumatic.      Nose: Nose normal. No congestion or rhinorrhea.      Mouth/Throat:      Mouth: Mucous membranes are not pale, not dry and not cyanotic. No oral lesions.      Pharynx: Oropharynx is clear. No oropharyngeal exudate or posterior oropharyngeal erythema.   Eyes:      General: No scleral icterus.        Right eye: No discharge.         Left eye: No discharge.      Extraocular Movements: Extraocular movements intact.      Conjunctiva/sclera: Conjunctivae normal.      Pupils: Pupils are equal, round, and reactive to light.   Neck:      Musculoskeletal: Normal range of motion and neck supple. No neck rigidity or muscular tenderness.   Cardiovascular:      Rate and Rhythm: Normal rate and regular rhythm.      Pulses: Normal pulses.      Heart sounds: Murmur present. No gallop.    Pulmonary:  Effort: No accessory muscle usage or respiratory distress.      Breath sounds: No stridor. No decreased breath sounds, wheezing, rhonchi or rales.      Comments: Port site healed  Chest:      Chest wall: No tenderness.   Abdominal:      General: Bowel sounds are normal. There is no distension.      Palpations: Abdomen is soft. There is no mass.      Tenderness: There is no abdominal tenderness. There is no guarding or rebound.   Musculoskeletal:         General: No swelling, tenderness or deformity.      Right lower leg: No edema.      Left lower leg: No edema.      Comments: Ambulating with cane   Lymphadenopathy:      Cervical: No cervical adenopathy.   Skin:     General: Skin is warm and dry.      Coloration: Skin is not jaundiced or pale.      Findings: Bruising (arms, healing) present. No erythema, lesion or rash. Neurological:      General: No focal deficit present.      Mental Status: He is alert and oriented to person, place, and time. Mental status is at baseline.      Cranial Nerves: No cranial nerve deficit.      Motor: No weakness.      Coordination: Coordination normal.      Gait: Gait normal.   Psychiatric:         Mood and Affect: Mood normal.         Behavior: Behavior normal.         Thought Content: Thought content normal.         Judgment: Judgment normal.             Results for orders placed or performed in visit on 01/29/19 (from the past 336 hour(s))   CBC AND DIFF   Result Value Ref Range    White Blood Cells 13.7 (H) 4.5 - 11.0 K/UL    RBC 3.66 (L) 4.4 - 5.5 M/UL    Hemoglobin 11.2 (L) 13.5 - 16.5 GM/DL    Hematocrit 16.1 (L) 40 - 50 %    MCV 92.4 80 - 100 FL    MCH 30.6 26 - 34 PG    MCHC 33.2 32.0 - 36.0 G/DL    RDW 09.6 (H) 11 - 15 %    Platelet Count 235 150 - 400 K/UL    MPV 7.8 7 - 11 FL    Neutrophils 79 (H) 41 - 77 %    Lymphocytes 11 (L) 24 - 44 %    Monocytes 9 4 - 12 %    Eosinophils 0 0 - 5 %    Basophils 1 0 - 2 %    Absolute Neutrophil Count 10.80 (H) 1.8 - 7.0 K/UL    Absolute Lymph Count 1.50 1.0 - 4.8 K/UL    Absolute Monocyte Count 1.30 (H) 0 - 0.80 K/UL    Absolute Eosinophil Count 0.00 0 - 0.45 K/UL    Absolute Basophil Count 0.10 0 - 0.20 K/UL   COMPREHENSIVE METABOLIC PANEL   Result Value Ref Range    Sodium 127 (L) 137 - 147 MMOL/L    Potassium 3.9 3.5 - 5.1 MMOL/L    Chloride 90 (L) 98 - 110 MMOL/L    Glucose 177 (H) 70 - 100 MG/DL  Blood Urea Nitrogen 96 (H) 7 - 25 MG/DL    Creatinine 8.11 (H) 0.4 - 1.24 MG/DL    Calcium 9.5 8.5 - 91.4 MG/DL    Total Protein 6.0 6.0 - 8.0 G/DL    Total Bilirubin 0.3 0.3 - 1.2 MG/DL    Albumin 3.4 (L) 3.5 - 5.0 G/DL    Alk Phosphatase 80 25 - 110 U/L    AST (SGOT) 22 7 - 40 U/L    CO2 26 21 - 30 MMOL/L    ALT (SGPT) 27 7 - 56 U/L    Anion Gap 11 3 - 12    eGFR Non African American 17 (L) >60 mL/min    eGFR African American 21 (L) >60 mL/min Lab Results   Component Value Date    CEA 3.0 (H) 01/16/2019    CEA 3.4 (H) 01/01/2019    CEA 3.1 (H) 12/18/2018    CEA 2.2 12/04/2018    CEA 1.6 11/20/2018    CEA 1.1 11/06/2018    CEA 1.3 10/23/2018    CEA 1.5 10/09/2018    CEA 1.5 09/25/2018    CEA 1.3 09/13/2018    CA199 55 (H) 01/16/2019    CA199 63 (H) 01/01/2019    CA199 54 (H) 12/18/2018    CA199 51 (H) 12/04/2018    CA199 36 (H) 11/20/2018    CA199 27 11/06/2018    CA199 29 10/23/2018    CA199 38 (H) 10/09/2018    CA199 52 (H) 09/25/2018    CA199 35 (H) 09/13/2018    PSA 4.64 05/15/2018     CT CHEST WO CONTRAST  Narrative: CT CHEST, ABDOMEN AND PELVIS    Clinical Indication: Cholangiocarcinoma monitoring intrahepatic cholangiocarcinoma    Technique: Multiple contiguous axial images were obtained through the chest, abdomen and pelvis without IV contrast material. Post processing coronal and sagittal reconstruction images were made from the axial images.      IV contrast: None.  Bowel contrast:  None    Comparison: CT chest/abdomen/pelvis 09/22/2018    CHEST FINDINGS:    Evaluation of the mediastinum and hila, including the vasculature and for lymphadenopathy, is limited without the use of IV contrast.    Lower Neck: Unchanged small hypodense thyroid nodules, which do not meet size threshold for further imaging evaluation.    Axilla, Mediastinum and Hila: Calcified mediastinal and bilateral hilar granulomas. No thoracic lymphadenopathy.     Heart and Great Vessels: Mild enlargement of the heart with extensive native coronary artery calcifications. Prior median sternotomy and CABG. Thoracic aorta is normal in caliber with marked scattered atherosclerotic calcification. 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 Right IJ chest port remains in place.    Airway, Lungs and Pleura:   *  Minimal retained are noted centrally within the trachea and left main bronchus.   *  Mild to moderate centrilobular and paraseptal emphysema. *  No pleural effusion or pneumothorax  *  Unchanged tiny perifissural nodule posteriorly in the right upper lobe since at least November 2015 (series 3 image 45)  *  Unchanged tiny nodule anteriorly in the right lower lobe (series 4 image 62) since at least March 28, 2017, which was also noted to be calcified on that exam.  *  Mild reticular nodular scarring laterally in the right middle lobe (series 4 image 80) and laterally within the lingula (series 4 image 85) has been present since at least March 28, 2017.    Chest Wall and Osseous Structures:  Prior median sternotomy. Multiple bilateral rib fracture deformities are unchanged. Mild to moderate thoracic spondylosis. At least mild bilateral glenohumeral joint arthrosis.    Abdomen and Pelvis Findings:    Limited evaluation without the use of IV contrast which includes the viscera and vasculature.    Liver and Biliary system: Liver is normal in size. No significant change in size of the low-attenuation mass within hepatic segment 4A, which now measures 4.5 x 4.1 cm (series 2 image 52), previously 4.3 x 4.2 cm. Unchanged minimal peripheral intrahepatic bile duct dilatation adjacent to the hepatic mass (series 2 image 52). Cholelithiasis. Adenomyomatosis of the gallbladder fundus.     Spleen: Unremarkable.    Adrenal Glands and Kidneys: Unremarkable adrenal glands. Prior right nephrectomy. No recurrent right renal fossa mass. Multiple simple, indeterminate, and hemorrhagic/proteinaceous left renal cyst, which remain incompletely characterized in the absence of IV contrast. No hydronephrosis.    Pancreas and Retroperitoneum: Unremarkable.    Aorta and Major Vessels: Saccular outpouching along the anterior aspect of the lower thoracic aorta measures 3.2 x 2.3 cm (series 2 image 55), previously 2.9 x 2.0 cm. 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 Additional areas of ectasia are noted within the abdominal aorta.    Bowel, Mesentery and Peritoneal space: Moderate distal colonic diverticulosis. Large and small bowel loops are normal in caliber. Normal appendix. No mesenteric lymphadenopathy or ascites.    Pelvis: Urinary bladder is mildly distended. Prostate gland is normal in size. No pelvic lymphadenopathy.    Abdominal wall and Osseous Structures: Small umbilical hernia containing a loop of nonobstructed small bowel. No destructive osseous lesions. Multilevel lumbar spondylosis with S-shaped curvature. Left posterior acetabular bone island.  Impression: CHEST:  1. No evidence of thoracic metastatic disease.  2. Mild to moderate emphysema with scattered areas of scarring.      ABDOMEN AND PELVIS:  1. No significant change in size of the biopsy-proven adenocarcinoma within hepatic segment 4.  2. No abdominopelvic lymphadenopathy or ascites.  3. 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  4. Prior right nephrectomy without evidence of right renal fossa mass.  5. Multiple probable simple and hemorrhagic renal cysts, which are incompletely characterized in the absence of IV contrast.    By my electronic signature, I attest that I have personally reviewed the images for this examination and formulated the interpretations and opinions expressed in this report     Finalized by Rosilyn Mings, M.D. on 12/15/2018 12:35 PM. Dictated by Lars Mage, M.D. on 12/15/2018 11:12 AM.  CT ABD/PELV WO CONTRAST  Narrative: CT CHEST, ABDOMEN AND PELVIS    Clinical Indication: Cholangiocarcinoma monitoring intrahepatic cholangiocarcinoma    Technique: Multiple contiguous axial images were obtained through the chest, abdomen and pelvis without IV contrast material. Post processing coronal and sagittal reconstruction images were made from the axial images.      IV contrast: None.  Bowel contrast:  None    Comparison: CT chest/abdomen/pelvis 09/22/2018    CHEST FINDINGS:    Evaluation of the mediastinum and hila, including the vasculature and for lymphadenopathy, is limited without the use of IV contrast.    Lower Neck: Unchanged small hypodense thyroid nodules, which do not meet size threshold for further imaging evaluation.  Axilla, Mediastinum and Hila: Calcified mediastinal and bilateral hilar granulomas. No thoracic lymphadenopathy.     Heart and Great Vessels: Mild enlargement of the heart with extensive native coronary artery calcifications. Prior median sternotomy and CABG. Thoracic aorta is normal in caliber with marked scattered atherosclerotic calcification. 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 Right IJ chest port remains in place.    Airway, Lungs and Pleura:   *  Minimal retained are noted centrally within the trachea and left main bronchus.   *  Mild to moderate centrilobular and paraseptal emphysema.   *  No pleural effusion or pneumothorax  *  Unchanged tiny perifissural nodule posteriorly in the right upper lobe since at least November 2015 (series 3 image 45)  *  Unchanged tiny nodule anteriorly in the right lower lobe (series 4 image 62) since at least March 28, 2017, which was also noted to be calcified on that exam.  *  Mild reticular nodular scarring laterally in the right middle lobe (series 4 image 80) and laterally within the lingula (series 4 image 85) has been present since at least March 28, 2017.    Chest Wall and Osseous Structures: Prior median sternotomy. Multiple bilateral rib fracture deformities are unchanged. Mild to moderate thoracic spondylosis. At least mild bilateral glenohumeral joint arthrosis.    Abdomen and Pelvis Findings:    Limited evaluation without the use of IV contrast which includes the viscera and vasculature. Liver and Biliary system: Liver is normal in size. No significant change in size of the low-attenuation mass within hepatic segment 4A, which now measures 4.5 x 4.1 cm (series 2 image 52), previously 4.3 x 4.2 cm. Unchanged minimal peripheral intrahepatic bile duct dilatation adjacent to the hepatic mass (series 2 image 52). Cholelithiasis. Adenomyomatosis of the gallbladder fundus.     Spleen: Unremarkable.    Adrenal Glands and Kidneys: Unremarkable adrenal glands. Prior right nephrectomy. No recurrent right renal fossa mass. Multiple simple, indeterminate, and hemorrhagic/proteinaceous left renal cyst, which remain incompletely characterized in the absence of IV contrast. No hydronephrosis.    Pancreas and Retroperitoneum: Unremarkable.    Aorta and Major Vessels: Saccular outpouching along the anterior aspect of the lower thoracic aorta measures 3.2 x 2.3 cm (series 2 image 55), previously 2.9 x 2.0 cm. 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 Additional areas of ectasia are noted within the abdominal aorta.    Bowel, Mesentery and Peritoneal space: Moderate distal colonic diverticulosis. Large and small bowel loops are normal in caliber. Normal appendix. No mesenteric lymphadenopathy or ascites.    Pelvis: Urinary bladder is mildly distended. Prostate gland is normal in size. No pelvic lymphadenopathy.    Abdominal wall and Osseous Structures: Small umbilical hernia containing a loop of nonobstructed small bowel. No destructive osseous lesions. Multilevel lumbar spondylosis with S-shaped curvature. Left posterior acetabular bone island.  Impression: CHEST:  1. No evidence of thoracic metastatic disease.  2. Mild to moderate emphysema with scattered areas of scarring.      ABDOMEN AND PELVIS:  1. No significant change in size of the biopsy-proven adenocarcinoma within hepatic segment 4. 2. No abdominopelvic lymphadenopathy or ascites.  3. 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  4. Prior right nephrectomy without evidence of  right renal fossa mass.  5. Multiple probable simple and hemorrhagic renal cysts, which are incompletely characterized in the absence of IV contrast.    By my electronic signature, I attest that I have personally reviewed the images for this examination and formulated the interpretations and opinions expressed in this report     Finalized by Rosilyn Mings, M.D. on 12/15/2018 12:35 PM. Dictated by Lars Mage, M.D. on 12/15/2018 11:12 AM.           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 positive for CK7 and negative for CK20, TTF-1, CDX-2, PAX-8, NKX 3.1, Hep-par1 and arginase-1. This immunoprofile is not specific but is compatible with upper gastrointestinal or pancreaticobiliary (including intrahepatic cholangiocarcinoma) origins. ???colonoscopy and EGD negative for original tumor. Seen by hepatobiliary surgery and recommended chemotherapy prior to any surgery. No surgery at this time due to current size and location of tumor. Imaging negative for distant metastatic disease. Pt agreed to chemotherapy. Port placed. Plan on gemcitabine and oxaliplatin (given renal failure, unable to use cisplatin). TEMPUS with IDH1 mutation, p72m [id number]. s/p C4 of Gem+oxaliplatin. CT 09/25/18 = Decrease in size of hypodense mass in segment 4 of the liver. Decrease Oxaliplatin to 65 mg/m2 for C5 and reassess further dosing if creatinine improves. Continue reduced doses  Plan:  S/p Cycle 10 Gem Ox on 12/04/18  CT 12/15/18 without contrast = stable disease.  Much improved today, ready to resume chemo. 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  RTC in 2 weeks with labs and visit for C13 with Edson Snowball, NP  RTC in 4 weeks with labs and visit for C14 with Edson Snowball, NP  Then CT scans.  RTC in 6 weeks with labs and visit for C13 with Edson Snowball, NP      2. 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 Path = Three foci of renal papillary carcinoma are present: Largest tumor measures 7.3 x 6.9 x 6.2 cm. Additional tumors measure 4.1 cm and 1.9 cm.  Decline in renal function and follows with Dr. Rocco Serene in nephrology and Dr. Perlie Gold in urology.   ???  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 ???He follows with a hematologist at [facility] in [place], [place]. No longer on hydroxyuria or having phlebotomy. On aspirin. More bruising and skin tears, now healing with break in chemo. Consider holding aspirin in future if this continues.     4. Hypertension. currently managed with carvedilol 25 mg bid.and amlodipine. BP elevated in clinic. BP has been running 140s/80s at home. 157/87 today 2/17. F/u with Nephrology. He was started on HCTZ on 09/14/18 by PCP and stopped on 09/22/18 HCTZ due to polyuria. Hospitalized 5/13 at [facility] with his local cardiologist for fluid overload. BP about the same but managed and followed by cardiology.     5. Stage 4 CKD. He had preexisting CKD; unclear etiology. Creatinine was 1.8 mg/dl and now baseline serum creatinine about 3 mg/dl. Follows with Dr. Rocco Serene. Today it is 3.49 and BUN 68 01/29/19. Diuretics adjusted. Weight down, breathing and ambulating better. Continue to follow with renal and urology.     6. Nausea.  From Chemo. controlled with PRN zofran, and compazine. Denies today.  7. Fatigue. From chemo, tolerable at grade one. Better with decadron. Worse with fluid overload. Better today and ready to resume.       8. Cold sensitivities. mild, however lasting almost 2 weeks.now persistent numbness and tingling (painful) in his feet. Hold oxaliplatin today, reassess in 2 weeks. Ok to use tramadol prn for this pain.   ???  9. Weight loss. From cold sensitivity and dysgeusia. Decadron daily until return and reassess. Weight down 14 lbs with diuretics. Reports eating about the same.       10. Back pain. Chronic. Worse with cancer. Tramadol not helping any more. Switch to hydrocodone. Pt is reluctant to start oxycodone due to history of alcoholism. He uses 2 pain pills in the am and one in the pm before bed. Stable. Refill today  ???  11. Edema. Renal and cardiac disease.now better.           Vanita Ingles, MD

## 2019-01-29 NOTE — Patient Instructions
Dr. Anup Kasi MD Medical Oncologist specializing in Gastro-Intestinal malignancies   Erin Carroll APRN    Nurses: Melynn Jones RN BSN and Mitul Hallowell RN MSN Phone: [phone] Fax: [phone], available Monday-Friday 8:00am- 4:00pm    On call number for urgent needs outside of business hours [phone]- ask for the On call Oncology fellow to be paged and they will call you back.    Medication refills: please contact your pharmacy for medication refills, if they do not have any refills on file they will contact the office, make sure they have our correct contact information on file   P: [phone], F: [phone]    MyChart Messages: All mychart messages are answered by the nurses, even if you select to send the message to Dr. Kasi or Erin.  We often communicate with Dr. Kasi and Erin on how to answer these messages, but all messages will come from the nurses.  Messages are answered 8:00am-4:00pm Monday-Friday, holidays excluded.    Phone Calls: We are hardly ever sitting at our desks where our phone is located as we are in clinic most days during the week. Please leave us a message as we check our messages several times per day.  It is our goal to answer these messages as soon as possible.  Messages are answered from 8:00am-4:00 pm Monday-Friday, holidays excluded.  Please make sure you leave your full name with the spelling, date of birth, reason for your call, and return number when leaving a message.

## 2019-01-29 NOTE — Progress Notes
CHEMO NOTE  Verified chemo consent signed and in chart.    Verified initiate chemo order in O2    Blood return positive via: Port (Single and Accessed)    BSA and dose double checked (agree with orders as written) with: yes     Labs/applicable tests checked: CBC and Comprehensive Metabolic Panel (CMP)    Chemo regime: Drug/cycle/day G25K2 Gemzar    Rate verified and armband double checkwith second RN: yes    Parameters met for tx. Oxaliplatin being held d/t neuropathy according to pt. Gemzar infusion completed without incident. Pt tolerated well. Pt reports having fallen outside in parking garage upon arrival. Abrasions present on rt forearm and lt lower leg. Bandages intact. Pt saw provider prior to treatment, so they are aware. Pt denies further needs/concerns at this time. Port flushed [employer]. Dismissed from clinic with transport.

## 2019-01-30 ENCOUNTER — Encounter: Admit: 2019-01-30 | Discharge: 2019-01-30

## 2019-02-05 ENCOUNTER — Encounter: Admit: 2019-02-05 | Discharge: 2019-02-05

## 2019-02-05 ENCOUNTER — Ambulatory Visit: Admit: 2019-02-05 | Discharge: 2019-02-06

## 2019-02-05 DIAGNOSIS — E785 Hyperlipidemia, unspecified: Secondary | ICD-10-CM

## 2019-02-05 DIAGNOSIS — I2510 Atherosclerotic heart disease of native coronary artery without angina pectoris: Secondary | ICD-10-CM

## 2019-02-05 DIAGNOSIS — R972 Elevated prostate specific antigen [PSA]: Secondary | ICD-10-CM

## 2019-02-05 DIAGNOSIS — I519 Heart disease, unspecified: Secondary | ICD-10-CM

## 2019-02-05 DIAGNOSIS — N250 Renal osteodystrophy: Secondary | ICD-10-CM

## 2019-02-05 DIAGNOSIS — I499 Cardiac arrhythmia, unspecified: Secondary | ICD-10-CM

## 2019-02-05 DIAGNOSIS — J438 Other emphysema: Secondary | ICD-10-CM

## 2019-02-05 DIAGNOSIS — C240 Malignant neoplasm of extrahepatic bile duct: Secondary | ICD-10-CM

## 2019-02-05 DIAGNOSIS — M1990 Unspecified osteoarthritis, unspecified site: Secondary | ICD-10-CM

## 2019-02-05 DIAGNOSIS — K319 Disease of stomach and duodenum, unspecified: Secondary | ICD-10-CM

## 2019-02-05 DIAGNOSIS — N2889 Other specified disorders of kidney and ureter: Secondary | ICD-10-CM

## 2019-02-05 DIAGNOSIS — H547 Unspecified visual loss: Secondary | ICD-10-CM

## 2019-02-05 DIAGNOSIS — I10 Essential (primary) hypertension: Secondary | ICD-10-CM

## 2019-02-05 DIAGNOSIS — D751 Secondary polycythemia: Secondary | ICD-10-CM

## 2019-02-05 DIAGNOSIS — K929 Disease of digestive system, unspecified: Secondary | ICD-10-CM

## 2019-02-05 DIAGNOSIS — C649 Malignant neoplasm of unspecified kidney, except renal pelvis: Secondary | ICD-10-CM

## 2019-02-05 DIAGNOSIS — M549 Dorsalgia, unspecified: Secondary | ICD-10-CM

## 2019-02-05 DIAGNOSIS — I639 Cerebral infarction, unspecified: Secondary | ICD-10-CM

## 2019-02-05 DIAGNOSIS — N189 Chronic kidney disease, unspecified: Secondary | ICD-10-CM

## 2019-02-05 NOTE — Patient Instructions
You can increase the lasix from 60 to 80 mg if your weight is rising into the 190's    Alternatively or additionally you could add metolazone daily or every other day till the weight gets below 190 lbs

## 2019-02-05 NOTE — Progress Notes
Date of Service: 02/05/2019    Subjective:             Chris Lambert is a 73 y.o. male.    History of Present Illness  He was referred by Dr. Perlie Gold with urology to establish care for CKD.  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  Post nephrectomy the serum creatinine has been around 3 mg/dl.  We have limited labs from prior to the nephrectomy, but the serum creatinine shortly prior to surgery was 1.74 mg/dl suggesting some underlying CKD. Most recent creatinine 3.53 mg/dl.     Past medical history includes a recent diagnosis of polycythemia vera which had been managed with phlebotomy and then hydroxyurea.  He follows with a hematologist at [facility] in [place], [place].  Also sees a cardiologist at [facility].  Has not had recent phlebotomy    There is a history of hypertension, currently managed with carvedilol, added in place of atenolol, and amlodipine.   Losartan stopped before surgery and hasn't been restarted.    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  Getting  Gemzar/Oxaloplatin; 5 cycles so far.  Will continue at least through March.      He has had some high K readings and the BP has been a little full.   PCP gave Hctz 25 mg for hyperkalemia and HTN.  He quit because he felt it made him urinate too much.     Fluid overload admit at [facility] 5/13 to 5/15.  Admit weight was 206 lbs which came down to 188 lbs at discharge after diuresis.       Review of Systems:  No fevers, chills, or sweats.  Good appetite. No weight loss.  No chest pains, palpitations, cough, shortness of breath or dyspnea on exertion.  Denies GI complaints.  No dysuria, or gross hematuria.  Denies flank or abdominal pain.      Objective:         ??? acetaminophen (TYLENOL) 500 mg tablet Take 1,000 mg by mouth every 6 hours as needed for Pain. Max of 4,000 mg of acetaminophen in 24 hours. ??? ADVAIR HFA 230-21 mcg/actuation inhaler INHALE 2 PUFFS TWICE DAILY RINSE MOUTH AND THROAT AFTER USE   ??? amLODIPine (NORVASC) 10 mg tablet Take 10 mg by mouth daily with breakfast.   ??? aspirin EC 81 mg tablet Take 81 mg by mouth daily with breakfast. Take with food.   ??? atorvastatin (LIPITOR) 20 mg tablet Take 20 mg by mouth daily.   ??? calcium carbonate (TUMS) 500 mg (200 mg elemental calcium) chewable tablet Chew 500-1,000 mg by mouth daily as needed.   ??? carvedilol (COREG) 25 mg tablet Take one tablet by mouth twice daily with meals. Take with food.   ??? dexAMETHasone (DECADRON) 4 mg tablet 1 tablet PO BID with meals on days 2 & 3 following chemotherapy. Then take one tab every morning. Do not take after 6 pm to avoid insomnia.   ??? docusate (COLACE) 100 mg capsule Take 100 mg by mouth daily with breakfast.   ??? esomeprazole DR (NEXIUM) 20 mg capsule Take 20 mg by mouth every morning. Take on an empty stomach at least 1 hour before or 2 hours after food.   ??? finasteride (PROSCAR) 5 mg tablet Take 5 mg by mouth daily with breakfast.   ??? furosemide (LASIX) 40 mg tablet 60 mg every morning.   ??? HYDROcodone/acetaminophen (NORCO) 5/325 mg  tablet Take one tablet by mouth every 4 hours as needed for Pain   ??? metOLazone (ZAROXOLYN) 5 mg tablet Take 5 mg by mouth daily.   ??? naphazoline HCl (CLEAR EYES OP) Apply 1-2 drops to both eyes daily as needed.   ??? ondansetron (ZOFRAN) 8 mg tablet 1 tablet PO BID with meals on days 2 & 3 following chemotherapy. Then use q 8 h PRN nausea or vomiting.   ??? polyethylene glycol 3350 (GLYCOLAX; MIRALAX) 17 gram/dose powder Take seventeen g by mouth daily.   ??? prochlorperazine maleate (COMPAZINE) 10 mg tablet 1 tablet q 6 h PRN for nausea not controlled by Zofran.   ??? tamsulosin (FLOMAX) 0.4 mg capsule Take 0.4 mg by mouth daily with dinner.   ??? traMADoL (ULTRAM) 50 mg tablet Take two tablets by mouth every 6 hours as needed.   ??? vitamins, multiple cap Take 1 capsule by mouth daily. Vitals:    02/05/19 1330   BP: 132/80   BP Source: Arm, Right Upper   Patient Position: Sitting   Pulse: 93   Temp: 36.7 ???C (98.1 ???F)   TempSrc: Oral   Weight: 84.4 kg (186 lb)   Height: 180.3 cm (71)   PainSc: Eight     Body mass index is 25.94 kg/m???.   Physical Exam:  Lungs: Clear to auscultation.  CV: Reg, S1, S2.  Abdomen: Soft, nontender. No palpable masses.  Extremities: Trace edema.    Iron   Date Value Ref Range Status   01/01/2019 91 50 - 185 MCG/DL Final     % Saturation   Date Value Ref Range Status   01/01/2019 30 28 - 42 % Final     Ferritin   Date Value Ref Range Status   01/01/2019 357 (H) 30 - 300 NG/ML Final     Phosphorus   Date Value Ref Range Status   01/01/2019 4.7 (H) 2.0 - 4.5 MG/DL Final     PTH Hormone   Date Value Ref Range Status   01/01/2019 230.2 (H) 10 - 65 PG/ML Final     Vitamin D(25-OH)Total   Date Value Ref Range Status   01/01/2019 21.9 (L) 30 - 80 NG/ML Final     Creatinine, Random   Date Value Ref Range Status   12/12/2017 164 MG/DL Final     Protein/CR ratio   Date Value Ref Range Status   12/12/2017 1.2  Final     Uric Acid   Date Value Ref Range Status   01/01/2019 10.1 (H) 4.0 - 8.0 MG/DL Final       CBC w/Diff    Lab Results   Component Value Date/Time    WBC 13.7 (H) 01/29/2019 08:14 AM    RBC 3.66 (L) 01/29/2019 08:14 AM    HGB 11.2 (L) 01/29/2019 08:14 AM    HCT 33.9 (L) 01/29/2019 08:14 AM    MCV 92.4 01/29/2019 08:14 AM    MCH 30.6 01/29/2019 08:14 AM    MCHC 33.2 01/29/2019 08:14 AM    RDW 16.0 (H) 01/29/2019 08:14 AM    PLTCT 235 01/29/2019 08:14 AM    MPV 7.8 01/29/2019 08:14 AM    Lab Results   Component Value Date/Time    NEUT 79 (H) 01/29/2019 08:14 AM    ANC 10.80 (H) 01/29/2019 08:14 AM    LYMA 11 (L) 01/29/2019 08:14 AM    ALC 1.50 01/29/2019 08:14 AM    MONA 9 01/29/2019 08:14 AM    AMC  1.30 (H) 01/29/2019 08:14 AM    EOSA 0 01/29/2019 08:14 AM    AEC 0.00 01/29/2019 08:14 AM    BASA 1 01/29/2019 08:14 AM    ABC 0.10 01/29/2019 08:14 AM Comprehensive Metabolic Profile    Lab Results   Component Value Date/Time    NA 127 (L) 01/29/2019 08:14 AM    K 3.9 01/29/2019 08:14 AM    CL 90 (L) 01/29/2019 08:14 AM    CO2 26 01/29/2019 08:14 AM    GAP 11 01/29/2019 08:14 AM    BUN 96 (H) 01/29/2019 08:14 AM    CR 3.53 (H) 01/29/2019 08:14 AM    GLU 177 (H) 01/29/2019 08:14 AM    Lab Results   Component Value Date/Time    CA 9.5 01/29/2019 08:14 AM    PO4 4.7 (H) 01/01/2019 09:55 AM    ALBUMIN 3.4 (L) 01/29/2019 08:14 AM    TOTPROT 6.0 01/29/2019 08:14 AM    ALKPHOS 80 01/29/2019 08:14 AM    AST 22 01/29/2019 08:14 AM    ALT 27 01/29/2019 08:14 AM    TOTBILI 0.3 01/29/2019 08:14 AM    GFR 17 (L) 01/29/2019 08:14 AM    GFRAA 21 (L) 01/29/2019 08:14 AM        Creatinine   Date Value Ref Range Status   01/29/2019 3.53 (H) 0.4 - 1.24 MG/DL Final   09/81/1914 7.82 (H) 0.4 - 1.24 MG/DL Final   95/62/1308 6.57 (H) 0.4 - 1.24 MG/DL Final   84/69/6295 2.84 (H) 0.4 - 1.24 MG/DL Final   13/24/4010 2.72 (H) 0.4 - 1.24 MG/DL Final   53/66/4403 4.74 (H) 0.4 - 1.24 MG/DL Final   25/95/6387 5.64 (H) 0.4 - 1.24 MG/DL Final   33/29/5188 4.16 (H) 0.4 - 1.24 MG/DL Final   60/63/0160 1.09 (H) 0.4 - 1.24 MG/DL Final   32/35/5732 2.02 (H) 0.4 - 1.24 MG/DL Final          Assessment and Plan:  1. Stage 4 CKD  - He had preexisting CKD; unclear etiology  - Creatinine was 1.8 mg/dl and now baseline serum creatinine seems close to 3 mg/dl.   - Serum creatinine 3.57 mg/dl most recently  - Hold off on resumption of ARB; has low to moderate range proteinuria    2. Hypertension  - Home BP readings are better  - Continue amlodipine 5 mg bid and carvedilol 12.5 mg bid and Lasix 60 mg daily.  - He is not taking metolazone   - Can increase lasix to 80 mg daily as needed to keep the weight from rising.    3. Polycythemia Vera  - Had been getting phlebotomy and then hydroxyurea.  - Relatively iron deficient.   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 Hyperkalemia  - Related to CKD in large part  - Better on lasix    6. Renal Osteodystrophy  - Vitamin D a little low; PTH up appropriately  - Reminded him to take vitamin D daily; he has been forgetting.    RTC 3 month    Amlodipine 5 bid, carvedilol 12.5 bid  Lasix 60-80  Metolazone 5 on hold  Taking vitamin D when he remembers

## 2019-02-06 DIAGNOSIS — I10 Essential (primary) hypertension: Secondary | ICD-10-CM

## 2019-02-06 DIAGNOSIS — D45 Polycythemia vera: Secondary | ICD-10-CM

## 2019-02-06 DIAGNOSIS — C221 Intrahepatic bile duct carcinoma: Secondary | ICD-10-CM

## 2019-02-06 DIAGNOSIS — E875 Hyperkalemia: Secondary | ICD-10-CM

## 2019-02-06 DIAGNOSIS — N184 Chronic kidney disease, stage 4 (severe): Principal | ICD-10-CM

## 2019-02-07 ENCOUNTER — Encounter: Admit: 2019-02-07 | Discharge: 2019-02-07

## 2019-02-07 MED ORDER — GEMCITABINE IVPB
1000 mg/m2 | Freq: Once | INTRAVENOUS | 0 refills | Status: CN
Start: 2019-02-07 — End: ?

## 2019-02-07 MED ORDER — DEXAMETHASONE 4 MG PO TAB
4 mg | Freq: Once | ORAL | 0 refills | Status: CN
Start: 2019-02-07 — End: ?

## 2019-02-12 ENCOUNTER — Ambulatory Visit: Admit: 2019-02-12 | Discharge: 2019-02-12

## 2019-02-12 ENCOUNTER — Encounter: Admit: 2019-02-12 | Discharge: 2019-02-12

## 2019-02-12 DIAGNOSIS — C649 Malignant neoplasm of unspecified kidney, except renal pelvis: Secondary | ICD-10-CM

## 2019-02-12 DIAGNOSIS — Z1159 Encounter for screening for other viral diseases: Secondary | ICD-10-CM

## 2019-02-12 DIAGNOSIS — C641 Malignant neoplasm of right kidney, except renal pelvis: Secondary | ICD-10-CM

## 2019-02-12 DIAGNOSIS — M549 Dorsalgia, unspecified: Secondary | ICD-10-CM

## 2019-02-12 DIAGNOSIS — K929 Disease of digestive system, unspecified: Secondary | ICD-10-CM

## 2019-02-12 DIAGNOSIS — K319 Disease of stomach and duodenum, unspecified: Secondary | ICD-10-CM

## 2019-02-12 DIAGNOSIS — H547 Unspecified visual loss: Secondary | ICD-10-CM

## 2019-02-12 DIAGNOSIS — N189 Chronic kidney disease, unspecified: Secondary | ICD-10-CM

## 2019-02-12 DIAGNOSIS — M1990 Unspecified osteoarthritis, unspecified site: Secondary | ICD-10-CM

## 2019-02-12 DIAGNOSIS — C240 Malignant neoplasm of extrahepatic bile duct: Secondary | ICD-10-CM

## 2019-02-12 DIAGNOSIS — I639 Cerebral infarction, unspecified: Secondary | ICD-10-CM

## 2019-02-12 DIAGNOSIS — D751 Secondary polycythemia: Secondary | ICD-10-CM

## 2019-02-12 DIAGNOSIS — I10 Essential (primary) hypertension: Secondary | ICD-10-CM

## 2019-02-12 DIAGNOSIS — I499 Cardiac arrhythmia, unspecified: Secondary | ICD-10-CM

## 2019-02-12 DIAGNOSIS — I519 Heart disease, unspecified: Secondary | ICD-10-CM

## 2019-02-12 DIAGNOSIS — N2889 Other specified disorders of kidney and ureter: Secondary | ICD-10-CM

## 2019-02-12 DIAGNOSIS — R972 Elevated prostate specific antigen [PSA]: Secondary | ICD-10-CM

## 2019-02-12 DIAGNOSIS — I2510 Atherosclerotic heart disease of native coronary artery without angina pectoris: Secondary | ICD-10-CM

## 2019-02-12 DIAGNOSIS — E785 Hyperlipidemia, unspecified: Secondary | ICD-10-CM

## 2019-02-12 DIAGNOSIS — J438 Other emphysema: Secondary | ICD-10-CM

## 2019-02-12 NOTE — Progress Notes
Patient arrived to [facility] for COVID-19 testing 02/12/19 1310. Patient identity confirmed via photo I.D. Nasopharyngeal procedure explained to the patient.   Nasopharyngeal swab completed right  Patient education provided given and instructed patient self isolate until contacted w/ results and further instructions.   Swab collected by Debbe Odea, RN.    Date symptoms began/reason for testing: Pre Procedure

## 2019-02-13 ENCOUNTER — Encounter: Admit: 2019-02-13 | Discharge: 2019-02-13

## 2019-02-13 DIAGNOSIS — N189 Chronic kidney disease, unspecified: Secondary | ICD-10-CM

## 2019-02-13 DIAGNOSIS — I519 Heart disease, unspecified: Secondary | ICD-10-CM

## 2019-02-13 DIAGNOSIS — M1990 Unspecified osteoarthritis, unspecified site: Secondary | ICD-10-CM

## 2019-02-13 DIAGNOSIS — H547 Unspecified visual loss: Secondary | ICD-10-CM

## 2019-02-13 DIAGNOSIS — E785 Hyperlipidemia, unspecified: Secondary | ICD-10-CM

## 2019-02-13 DIAGNOSIS — M549 Dorsalgia, unspecified: Secondary | ICD-10-CM

## 2019-02-13 DIAGNOSIS — C649 Malignant neoplasm of unspecified kidney, except renal pelvis: Secondary | ICD-10-CM

## 2019-02-13 DIAGNOSIS — I10 Essential (primary) hypertension: Secondary | ICD-10-CM

## 2019-02-13 DIAGNOSIS — I499 Cardiac arrhythmia, unspecified: Secondary | ICD-10-CM

## 2019-02-13 DIAGNOSIS — R972 Elevated prostate specific antigen [PSA]: Secondary | ICD-10-CM

## 2019-02-13 DIAGNOSIS — K319 Disease of stomach and duodenum, unspecified: Secondary | ICD-10-CM

## 2019-02-13 DIAGNOSIS — J438 Other emphysema: Secondary | ICD-10-CM

## 2019-02-13 DIAGNOSIS — I639 Cerebral infarction, unspecified: Secondary | ICD-10-CM

## 2019-02-13 DIAGNOSIS — C240 Malignant neoplasm of extrahepatic bile duct: Secondary | ICD-10-CM

## 2019-02-13 DIAGNOSIS — K929 Disease of digestive system, unspecified: Secondary | ICD-10-CM

## 2019-02-13 DIAGNOSIS — D751 Secondary polycythemia: Secondary | ICD-10-CM

## 2019-02-13 DIAGNOSIS — N2889 Other specified disorders of kidney and ureter: Secondary | ICD-10-CM

## 2019-02-13 DIAGNOSIS — I2510 Atherosclerotic heart disease of native coronary artery without angina pectoris: Secondary | ICD-10-CM

## 2019-02-13 LAB — COVID-19 (SARS-COV-2) PCR

## 2019-02-13 NOTE — Progress Notes
Contacted patient and confirmed name and DOB. Patient advised that COVID-19 test results are negative. Advised that patient can continue with the procedure and should follow pre-procedure instructions. Advised that if they develop any concerning symptoms prior to the procedure to contact their procedure team, specialist, and/or PCP for assistance.

## 2019-02-14 ENCOUNTER — Encounter: Admit: 2019-02-14 | Discharge: 2019-02-14

## 2019-02-14 DIAGNOSIS — R200 Anesthesia of skin: Secondary | ICD-10-CM

## 2019-02-14 DIAGNOSIS — I10 Essential (primary) hypertension: Secondary | ICD-10-CM

## 2019-02-14 DIAGNOSIS — R42 Dizziness and giddiness: Secondary | ICD-10-CM

## 2019-02-14 DIAGNOSIS — R972 Elevated prostate specific antigen [PSA]: Secondary | ICD-10-CM

## 2019-02-14 DIAGNOSIS — D45 Polycythemia vera: Secondary | ICD-10-CM

## 2019-02-14 DIAGNOSIS — R0602 Shortness of breath: Secondary | ICD-10-CM

## 2019-02-14 DIAGNOSIS — M2550 Pain in unspecified joint: Secondary | ICD-10-CM

## 2019-02-14 DIAGNOSIS — C649 Malignant neoplasm of unspecified kidney, except renal pelvis: Secondary | ICD-10-CM

## 2019-02-14 DIAGNOSIS — M1990 Unspecified osteoarthritis, unspecified site: Secondary | ICD-10-CM

## 2019-02-14 DIAGNOSIS — R531 Weakness: Secondary | ICD-10-CM

## 2019-02-14 DIAGNOSIS — I129 Hypertensive chronic kidney disease with stage 1 through stage 4 chronic kidney disease, or unspecified chronic kidney disease: Secondary | ICD-10-CM

## 2019-02-14 DIAGNOSIS — K929 Disease of digestive system, unspecified: Secondary | ICD-10-CM

## 2019-02-14 DIAGNOSIS — I499 Cardiac arrhythmia, unspecified: Secondary | ICD-10-CM

## 2019-02-14 DIAGNOSIS — R2240 Localized swelling, mass and lump, unspecified lower limb: Secondary | ICD-10-CM

## 2019-02-14 DIAGNOSIS — N184 Chronic kidney disease, stage 4 (severe): Secondary | ICD-10-CM

## 2019-02-14 DIAGNOSIS — C240 Malignant neoplasm of extrahepatic bile duct: Secondary | ICD-10-CM

## 2019-02-14 DIAGNOSIS — R634 Abnormal weight loss: Secondary | ICD-10-CM

## 2019-02-14 DIAGNOSIS — C221 Intrahepatic bile duct carcinoma: Principal | ICD-10-CM

## 2019-02-14 DIAGNOSIS — J438 Other emphysema: Secondary | ICD-10-CM

## 2019-02-14 DIAGNOSIS — I639 Cerebral infarction, unspecified: Secondary | ICD-10-CM

## 2019-02-14 DIAGNOSIS — M549 Dorsalgia, unspecified: Secondary | ICD-10-CM

## 2019-02-14 DIAGNOSIS — H547 Unspecified visual loss: Secondary | ICD-10-CM

## 2019-02-14 DIAGNOSIS — N189 Chronic kidney disease, unspecified: Secondary | ICD-10-CM

## 2019-02-14 DIAGNOSIS — R530 Neoplastic (malignant) related fatigue: Secondary | ICD-10-CM

## 2019-02-14 DIAGNOSIS — E785 Hyperlipidemia, unspecified: Secondary | ICD-10-CM

## 2019-02-14 DIAGNOSIS — I519 Heart disease, unspecified: Secondary | ICD-10-CM

## 2019-02-14 DIAGNOSIS — I2510 Atherosclerotic heart disease of native coronary artery without angina pectoris: Secondary | ICD-10-CM

## 2019-02-14 DIAGNOSIS — N2889 Other specified disorders of kidney and ureter: Secondary | ICD-10-CM

## 2019-02-14 DIAGNOSIS — K319 Disease of stomach and duodenum, unspecified: Secondary | ICD-10-CM

## 2019-02-14 DIAGNOSIS — D751 Secondary polycythemia: Secondary | ICD-10-CM

## 2019-02-14 MED ORDER — HYDROCODONE-ACETAMINOPHEN 10-325 MG PO TAB
1-2 | ORAL_TABLET | ORAL | 0 refills | 30.00000 days | Status: AC | PRN
Start: 2019-02-14 — End: ?

## 2019-02-14 MED ORDER — ONDANSETRON HCL 8 MG PO TAB
16 mg | Freq: Once | ORAL | 0 refills | Status: CP
Start: 2019-02-14 — End: ?
  Administered 2019-02-14: 16:00:00 16 mg via ORAL

## 2019-02-14 MED ORDER — SODIUM CHLORIDE 0.9 % IV SOLP
500 mL | Freq: Once | INTRAVENOUS | 0 refills | Status: CP
Start: 2019-02-14 — End: ?
  Administered 2019-02-14: 16:00:00 500 mL via INTRAVENOUS

## 2019-02-14 MED ORDER — DEXAMETHASONE 4 MG PO TAB
4 mg | Freq: Once | ORAL | 0 refills | Status: CP
Start: 2019-02-14 — End: ?
  Administered 2019-02-14: 16:00:00 4 mg via ORAL

## 2019-02-14 MED ORDER — GEMCITABINE IVPB
1000 mg/m2 | Freq: Once | INTRAVENOUS | 0 refills | Status: CP
Start: 2019-02-14 — End: ?
  Administered 2019-02-14 (×3): 2039.9 mg via INTRAVENOUS

## 2019-02-14 MED ORDER — HEPARIN, PORCINE (PF) 100 UNIT/ML IV SYRG
500 [IU] | Freq: Once | 0 refills | Status: CP
Start: 2019-02-14 — End: ?

## 2019-02-14 NOTE — Progress Notes
Name: Chris Lambert          MRN: [id number]      DOB: 07-05-1946      AGE: 73 y.o.   DATE OF SERVICE: 02/14/2019    Subjective:             Reason for Visit:  Follow Up    Chris Lambert is a 73 y.o. male.     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 Biopsy = Poorly differentiated adenocarcinoma. positive for CK7 and negative for CK20, TTF-1, CDX-2, PAX-8, NKX 3.1, Hep-par1 and arginase-1. This immunoprofile is not specific but is compatible with upper gastrointestinal or pancreaticobiliary (including intrahepatic cholangiocarcinoma) origins. ???colonoscopy and EGD negative for original tumor. Seen by hepatobiliary surgery and recommended chemotherapy prior to any surgery. No surgery at this time due to current size and location of tumor. Imaging negative for distant metastatic disease. Pt agreed to chemotherapy. Port placed. Plan on gemcitabine and oxaliplatin (given renal failure, unable to use cisplatin). C1 started on 07/28/18      Interval history:  He is feeling pretty good and tolerating chemo well. He is breathing better and moving around better. His appetite is fair but food doesn't taste very good. His weight is stable. He denies N/V or diarrhea. His back is hurting a little more and hydrocodone is helping. The numbness is a little better. He bruises easily but denies frank bleeding. He has seen urology and was told things are stable.         Review of Systems   Constitutional: Positive for fatigue. Negative for activity change, appetite change, chills, diaphoresis, fever and unexpected weight change.   HENT: Negative for congestion, drooling, facial swelling, mouth sores, nosebleeds, postnasal drip, sore throat and trouble swallowing. Eyes: Negative.  Negative for visual disturbance.   Respiratory: Positive for shortness of breath (with exertion, better this week). Negative for cough, chest tightness and wheezing.    Cardiovascular: Positive for leg swelling (better). Negative for chest pain and palpitations.   Gastrointestinal: Negative.  Negative for abdominal distention, abdominal pain, anal bleeding, blood in stool, constipation, diarrhea, nausea, rectal pain and vomiting.   Endocrine: Positive for cold intolerance.   Genitourinary: Negative.  Negative for decreased urine volume, difficulty urinating, dysuria, frequency and hematuria.   Musculoskeletal: Positive for arthralgias and back pain. Negative for gait problem, joint swelling, myalgias and neck pain.   Skin: Negative for color change, pallor, rash and wound.   Neurological: Positive for dizziness, weakness and numbness. Negative for light-headedness and headaches.   Psychiatric/Behavioral: Negative.  Negative for agitation, decreased concentration and sleep disturbance. The patient is not nervous/anxious.      Medical History:   Diagnosis Date   ??? Arrhythmia     A fibrillation briefly s/p CABG   ??? Arthritis    ??? Back pain    ??? Cancer of hepatic duct (HCC)     2019   ??? Cancer of kidney (HCC)    ??? CKD (chronic kidney disease)    ??? Coronary artery disease    ??? Elevated PSA    ??? Gastrointestinal disorder    ??? Heart disease    ??? Hyperlipidemia    ??? Hypertension    ??? Other emphysema (HCC)    ??? Polycythemia    ???  Renal mass    ??? Stomach disorder    ??? Stroke (HCC)     2014   ??? Stroke [facility])    ??? Vision decreased      Surgical History:   Procedure Laterality Date   ??? NEPHRECTOMY  2018    RIGHT WAS REMOVED   ??? ROBOT-ASSISTED LAPAROSCOPIC RADICAL NEPHRECTOMY; INTRAOPERATIVE ULTRASOUND. Right 07/26/2017    Performed by Earl Many, MD at [facility] OR   ??? ESOPHAGOGASTRODUODENOSCOPY WITH TRANSENDOSCOPIC ULTRASOUND GUIDED FINE NEEDLE ASPIRATION/ BIOPSY N/A 07/14/2018 Performed by Bernita Buffy, MD at [facility] OR   ??? COLONOSCOPY WITH BIOPSY - FLEXIBLE N/A 07/14/2018    Performed by Bernita Buffy, MD at [facility] OR   ??? ESOPHAGOGASTRODUODENOSCOPY WITH BIOPSY - FLEXIBLE N/A 07/14/2018    Performed by Bernita Buffy, MD at [id number] OR   ??? HX CORONARY ARTERY BYPASS GRAFT     ??? HX NEPHRECTOMY     ??? MOUTH SURGERY       Social History     Socioeconomic History   ??? Marital status: Married     Spouse name: Not on file   ??? Number of children: Not on file   ??? Years of education: Not on file   ??? Highest education level: Not on file   Occupational History   ??? Not on file   Tobacco Use   ??? Smoking status: Former Smoker     Last attempt to quit: 2012     Years since quitting: 8.4   ??? Smokeless tobacco: Never Used   Substance and Sexual Activity   ??? Alcohol use: No     Comment: Quit in 1986    ??? Drug use: No   ??? Sexual activity: Not Currently     Partners: Female   Other Topics Concern   ??? Not on file   Social History Narrative   ??? Not on file     No Known Allergies      Objective:         ??? acetaminophen (TYLENOL) 500 mg tablet Take 1,000 mg by mouth every 6 hours as needed for Pain. Max of 4,000 mg of acetaminophen in 24 hours.   ??? ADVAIR HFA 230-21 mcg/actuation inhaler INHALE 2 PUFFS TWICE DAILY RINSE MOUTH AND THROAT AFTER USE   ??? amLODIPine (NORVASC) 10 mg tablet Take 10 mg by mouth daily with breakfast.   ??? aspirin EC 81 mg tablet Take 81 mg by mouth daily with breakfast. Take with food.   ??? atorvastatin (LIPITOR) 20 mg tablet Take 20 mg by mouth daily.   ??? calcium carbonate (TUMS) 500 mg (200 mg elemental calcium) chewable tablet Chew 500-1,000 mg by mouth daily as needed.   ??? carvedilol (COREG) 25 mg tablet Take one tablet by mouth twice daily with meals. Take with food.   ??? dexAMETHasone (DECADRON) 4 mg tablet 1 tablet PO BID with meals on days 2 & 3 following chemotherapy. Then take one tab every morning. Do not take after 6 pm to avoid insomnia. ??? docusate (COLACE) 100 mg capsule Take 100 mg by mouth daily with breakfast.   ??? finasteride (PROSCAR) 5 mg tablet Take 5 mg by mouth daily with breakfast.   ??? furosemide (LASIX) 40 mg tablet 60 mg every morning.   ??? HYDROcodone/acetaminophen (NORCO) 5/325 mg tablet Take one tablet by mouth every 4 hours as needed for Pain   ??? naphazoline HCl (CLEAR EYES OP) Apply 1-2 drops to both eyes daily as  needed.   ??? ondansetron (ZOFRAN) 8 mg tablet 1 tablet PO BID with meals on days 2 & 3 following chemotherapy. Then use q 8 h PRN nausea or vomiting.   ??? polyethylene glycol 3350 (GLYCOLAX; MIRALAX) 17 gram/dose powder Take seventeen g by mouth daily.   ??? prochlorperazine maleate (COMPAZINE) 10 mg tablet 1 tablet q 6 h PRN for nausea not controlled by Zofran.   ??? tamsulosin (FLOMAX) 0.4 mg capsule Take 0.4 mg by mouth daily with dinner.   ??? traMADoL (ULTRAM) 50 mg tablet Take two tablets by mouth every 6 hours as needed.   ??? vitamins, multiple cap Take 1 capsule by mouth daily.     Vitals:    02/14/19 0945   BP: (!) 156/91   BP Source: Arm, Left Upper   Patient Position: Sitting   Pulse: 87   Resp: 16   Temp: 36.5 ???C (97.7 ???F)   TempSrc: Oral   SpO2: 98%   Weight: 85.8 kg (189 lb 3.2 oz)   Height: 180.3 cm (71)   PainSc: Seven     Body mass index is 26.39 kg/m???.     Pain Score: Seven  Pain Loc: Back    Fatigue Scale: 6    Pain Addressed:  Current regimen working to control pain.     Patient Evaluated for a Clinical Trial: No treatment clinical trial available for this patient.     [place] Cooperative Oncology Group performance status is 2, Ambulatory and capable of all selfcare but unable to carry out any work activities. Up and about more than 50% of waking hours.     Physical Exam  Vitals signs reviewed.   Constitutional:       General: He is not in acute distress.     Appearance: Normal appearance. He is well-developed. He is not ill-appearing, toxic-appearing or diaphoretic.   HENT: Head: Normocephalic and atraumatic.      Nose: Nose normal. No congestion or rhinorrhea.      Mouth/Throat:      Mouth: Mucous membranes are not pale, not dry and not cyanotic. No oral lesions.      Pharynx: Oropharynx is clear. No oropharyngeal exudate or posterior oropharyngeal erythema.   Eyes:      General: No scleral icterus.        Right eye: No discharge.         Left eye: No discharge.      Extraocular Movements: Extraocular movements intact.      Conjunctiva/sclera: Conjunctivae normal.      Pupils: Pupils are equal, round, and reactive to light.   Neck:      Musculoskeletal: Normal range of motion and neck supple. No neck rigidity or muscular tenderness.   Cardiovascular:      Rate and Rhythm: Normal rate and regular rhythm.      Pulses: Normal pulses.      Heart sounds: Murmur present. No gallop.    Pulmonary:      Effort: No accessory muscle usage or respiratory distress.      Breath sounds: No stridor. Examination of the right-lower field reveals decreased breath sounds. Examination of the left-lower field reveals decreased breath sounds. Decreased breath sounds present. No wheezing, rhonchi or rales.      Comments: Port site healed  Chest:      Chest wall: No tenderness.   Abdominal:      General: Bowel sounds are normal. There is no distension.      Palpations: Abdomen  is soft. There is no mass.      Tenderness: There is no abdominal tenderness. There is no guarding or rebound.   Musculoskeletal:         General: No swelling, tenderness or deformity.      Right lower leg: No edema.      Left lower leg: No edema.      Comments: Ambulating with cane   Lymphadenopathy:      Cervical: No cervical adenopathy.   Skin:     General: Skin is warm and dry.      Coloration: Skin is not jaundiced or pale.      Findings: Bruising (arms) present. No erythema, lesion or rash.   Neurological:      General: No focal deficit present.      Mental Status: He is alert and oriented to person, place, and time. Mental status is at baseline.      Cranial Nerves: No cranial nerve deficit.      Motor: No weakness.      Coordination: Coordination normal.      Gait: Gait normal.   Psychiatric:         Mood and Affect: Mood normal.         Behavior: Behavior normal.         Thought Content: Thought content normal.         Judgment: Judgment normal.             Results for orders placed or performed in visit on 02/14/19 (from the past 336 hour(s))   CBC AND DIFF   Result Value Ref Range    White Blood Cells 11.6 (H) 4.5 - 11.0 K/UL    RBC 3.74 (L) 4.4 - 5.5 M/UL    Hemoglobin 11.5 (L) 13.5 - 16.5 GM/DL    Hematocrit 56.2 (L) 40 - 50 %    MCV 94.6 80 - 100 FL    MCH 30.9 26 - 34 PG    MCHC 32.6 32.0 - 36.0 G/DL    RDW 13.0 (H) 11 - 15 %    Platelet Count 220 150 - 400 K/UL    MPV 8.2 7 - 11 FL    Segmented Neutrophils 63 41 - 77 %    Bands 1 0 - 10 %    Lymphocytes 21 (L) 24 - 44 %    Monocytes 11 4 - 12 %    Metamyelocyte 1 %    Myelocyte 3 %    ANISO PRESENT     POIK PRESENT     POLY PRESENT     Ovalocyte PRESENT     Platelet Estimate NORMAL     Absolute Neutrophil Count Manual 7.43 (H) 1.8 - 7.0 K/UL   COMPREHENSIVE METABOLIC PANEL   Result Value Ref Range    Sodium 134 (L) 137 - 147 MMOL/L    Potassium 4.4 3.5 - 5.1 MMOL/L    Chloride 98 98 - 110 MMOL/L    Glucose 89 70 - 100 MG/DL    Blood Urea Nitrogen 71 (H) 7 - 25 MG/DL    Creatinine 8.65 (H) 0.4 - 1.24 MG/DL    Calcium 9.6 8.5 - 78.4 MG/DL    Total Protein 5.9 (L) 6.0 - 8.0 G/DL    Total Bilirubin 0.5 0.3 - 1.2 MG/DL    Albumin 3.5 3.5 - 5.0 G/DL    Alk Phosphatase 70 25 - 110 U/L    AST (SGOT) 52 (H) 7 - 40  U/L    CO2 28 21 - 30 MMOL/L    ALT (SGPT) 96 (H) 7 - 56 U/L    Anion Gap 8 3 - 12    eGFR Non African American 18 (L) >60 mL/min    eGFR African American 21 (L) >60 mL/min     Lab Results   Component Value Date    CEA 2.5 01/29/2019    CEA 3.0 (H) 01/16/2019    CEA 3.4 (H) 01/01/2019    CEA 3.1 (H) 12/18/2018    CEA 2.2 12/04/2018    CEA 1.6 11/20/2018 CEA 1.1 11/06/2018    CEA 1.3 10/23/2018    CEA 1.5 10/09/2018    CEA 1.5 09/25/2018    CA199 49 (H) 01/29/2019    CA199 55 (H) 01/16/2019    CA199 63 (H) 01/01/2019    CA199 54 (H) 12/18/2018    CA199 51 (H) 12/04/2018    CA199 36 (H) 11/20/2018    CA199 27 11/06/2018    CA199 29 10/23/2018    CA199 38 (H) 10/09/2018    CA199 52 (H) 09/25/2018    PSA 4.64 05/15/2018     US RENAL BLADDER LTD  Narrative: RENAL ULTRASOUND    CLINICAL INDICATION: Male, 73 years old; malignant neoplasm of right kidney.    TECHNIQUE: Multiple grayscale ultrasound images were obtained through the kidneys and urinary bladder.    COMPARISON: CT abdomen/pelvis 09/22/2018. Renal ultrasound 11/14/2017    FINDINGS:    Right kidney: Prior right nephrectomy. No mass in the right renal fossa.    Left kidney: Measures 18.0 cm. There are numerous simple and mildly complex cysts in the left kidney. The largest simple cyst measures 4.7 x 3.7 cm in the lower pole of the left kidney There is a small hypoechoic area in the inferior pole of the left kidney that measures 2.4 x 1.5 x 1.9 cm. Minimal blood flow is noted immediately adjacent to the inner surface of the nodule but no definite internal blood flow seen within the nodule...  No hydronephrosis.    Urinary bladder: Normal. Mild prostate enlargement.  Impression: 1.  Prior right nephrectomy without visualized recurrent mass in the right renal fossa.  2.  Multiple simple and hemorrhagic left renal cysts.  3.  Hypoechoic mass in the inferior pole of the left kidney measuring up to 2.4 cm with peripheral blood flow. This is nonspecific and may represent an additional hemorrhagic cyst. However, a neoplastic process cannot be excluded. An MRI with renal mass protocol could be obtained for further evaluation.    By my electronic signature, I attest that I have personally reviewed the images for this examination and formulated the interpretations and opinions expressed in this report Finalized by Mallie Darting, M.D. on 02/12/2019 2:58 PM. Dictated by Twana First, M.D. on 02/12/2019 2:36 PM.           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 positive for CK7 and negative for CK20, TTF-1, CDX-2, PAX-8, NKX 3.1, Hep-par1 and arginase-1. This immunoprofile is not specific but is compatible with upper gastrointestinal or pancreaticobiliary (including intrahepatic cholangiocarcinoma) origins. ???colonoscopy and EGD negative for original tumor. Seen by hepatobiliary surgery and recommended chemotherapy prior to any surgery. No surgery at this time due to current size and location of tumor. Imaging negative for distant metastatic disease. Pt agreed to chemotherapy. Port placed. Plan on gemcitabine and oxaliplatin (  given renal failure, unable to use cisplatin). TEMPUS with IDH1 mutation, p60m kdm6A. s/p C4 of Gem+oxaliplatin. CT 09/25/18 = Decrease in size of hypodense mass in segment 4 of the liver. Decrease Oxaliplatin to 65 mg/m2 for C5 and reassess further dosing if creatinine improves. Continue reduced doses  Plan:  S/p Cycle 10 Gem Ox on 12/04/18  CT 12/15/18 without contrast = stable disease.  Tolerating chemo well  Ok to proceed with Cycle 13 today 02/14/19, but continue to hold oxaliplatin for neuropathy/falls, decrease IVF to 500cc as he is on diuretic but sodium is low.  RTC in 2 weeks with labs and visit for C14   CT scans after cycle 14  Continue supportive care.   covid negative, continue monthly    2. 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 Path = Three foci of renal papillary carcinoma are present: Largest tumor measures 7.3 x 6.9 x 6.2 cm. Additional tumors measure 4.1 cm and 1.9 cm.  Decline in renal function and follows with Dr. Rocco Serene in nephrology and Dr. Perlie Gold in urology.   ??? 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 ???He follows with a hematologist at [facility] in [place], [place]. No longer on hydroxyuria or having phlebotomy. On aspirin. More bruising and skin tears, now healing with break in chemo. Consider holding aspirin in future if this continues.     4. Hypertension. currently managed with carvedilol 25 mg bid.and amlodipine. BP elevated in clinic. BP has been running 140s/80s at home. 157/87 today 2/17. F/u with Nephrology. He was started on HCTZ on 09/14/18 by PCP and stopped on 09/22/18 HCTZ due to polyuria. Hospitalized 5/13 at [facility] with his local cardiologist for fluid overload. BP about the same but managed and followed by cardiology.     5. Stage 4 CKD. He had preexisting CKD; unclear etiology. Creatinine was 1.8 mg/dl and now baseline serum creatinine about 3 mg/dl. Follows with Dr. Rocco Serene. Today it is 3.43 and BUN 71 02/14/19. Diuretics adjusted. Weight down, breathing and ambulating better. Continue to follow with renal and urology.     6. Nausea.  From Chemo. controlled with PRN zofran, and compazine. Denies today.    7. Fatigue. From chemo, tolerable at grade one. Better with decadron. Worse with fluid overload. Better today and ready to continue       8. Cold sensitivities. mild, however lasting almost 2 weeks.now persistent numbness and tingling (painful) in his feet. Hold oxaliplatin today, reassess in 2 weeks. Ok to use hydrocodone prn for this pain.   ???  9. Weight loss. From cold sensitivity and dysgeusia. Stable today       10. Back pain. Chronic. Worse with cancer. Tramadol not helping any more. Switch to hydrocodone. Pt is reluctant to start oxycodone due to history of alcoholism. He uses 2 pain pills in the am and one in the pm before bed. Stable. Refill today  ???  11. Edema. Renal and cardiac disease. now better.           Lianne Moris, APRN          The patient and family were allowed to ask questions and voice concerns; these were addressed to the best of our ability. They expressed understanding of what was explained to them, and they agreed with the present plan. RTC in 2 weeks with labs to see a provider. Patient has the phone numbers for the Cancer Center and was instructed  on how to contact us with any questions or concerns. My collaborating physician on this case is Dr. Eldred Manges.

## 2019-02-14 NOTE — Patient Instructions
[facility]  Chemotherapy Instructions    Chris Lambert 02/14/2019    Call Immediately to report the following:  Unexplained bleeding or bleeding that will not stop  Difficulty swallowing  Shortness of breath, wheezing, or trouble breathing  Rapid, irregular heartbeat; chest pain  Dizziness, lightheadedness  Rash or cut that swells or turns red, feels hot or painful, or begin to ooze  Diarrhea   Uncontrolled nausea or vomiting  Fever of 100.4 F or higher, or chills    Important Phone Numbers:  [place] Number (answered 24 hours a day) [phone]

## 2019-02-14 NOTE — Progress Notes
CHEMO NOTE  Verified chemo consent signed and in chart.    Verified initiate chemo order in O2    Blood return positive via: Port (Single)    BSA and dose double checked (agree with orders as written) with: yes     Labs/applicable tests checked: CBC and Comprehensive Metabolic Panel (CMP) okay to treat with Crcl 23.3 per Liana Crocker    Chemo regime: Drug/cycle/day    gemcitabine (GEMZAR) 2,039.9 mg in sodium chloride 0.9% (NS) 303.6494 mL IVPB    Rate verified and armband double checkwith second RN: yes    Patient education offered and stated understanding. Denies questions at this time.    Patient arrived to CC treatment for C13D1 Gemzar. Pt seen in clinic today by Liana Crocker. Pt reports all needs addressed by provider, denies new concerns at this time. PAC accessed in lab, flushes, with positive blood return noted.Premedications, hydration and chemotherapy initiated per treatment plan. Treatment completed, pt tolerated w/o adverse event. PAC flushed and de-accessed. Pt left treatment ambulatory via wheelchair accompanied by transport w/o further questions or concerns.

## 2019-02-26 ENCOUNTER — Encounter: Admit: 2019-02-26 | Discharge: 2019-02-26

## 2019-02-26 DIAGNOSIS — I2510 Atherosclerotic heart disease of native coronary artery without angina pectoris: Secondary | ICD-10-CM

## 2019-02-26 DIAGNOSIS — Z87891 Personal history of nicotine dependence: Secondary | ICD-10-CM

## 2019-02-26 DIAGNOSIS — C221 Intrahepatic bile duct carcinoma: Secondary | ICD-10-CM

## 2019-02-26 DIAGNOSIS — K929 Disease of digestive system, unspecified: Secondary | ICD-10-CM

## 2019-02-26 DIAGNOSIS — M549 Dorsalgia, unspecified: Secondary | ICD-10-CM

## 2019-02-26 DIAGNOSIS — E785 Hyperlipidemia, unspecified: Secondary | ICD-10-CM

## 2019-02-26 DIAGNOSIS — C240 Malignant neoplasm of extrahepatic bile duct: Secondary | ICD-10-CM

## 2019-02-26 DIAGNOSIS — I639 Cerebral infarction, unspecified: Secondary | ICD-10-CM

## 2019-02-26 DIAGNOSIS — D751 Secondary polycythemia: Secondary | ICD-10-CM

## 2019-02-26 DIAGNOSIS — N2889 Other specified disorders of kidney and ureter: Secondary | ICD-10-CM

## 2019-02-26 DIAGNOSIS — R978 Other abnormal tumor markers: Secondary | ICD-10-CM

## 2019-02-26 DIAGNOSIS — I10 Essential (primary) hypertension: Secondary | ICD-10-CM

## 2019-02-26 DIAGNOSIS — I519 Heart disease, unspecified: Secondary | ICD-10-CM

## 2019-02-26 DIAGNOSIS — K319 Disease of stomach and duodenum, unspecified: Secondary | ICD-10-CM

## 2019-02-26 DIAGNOSIS — M1990 Unspecified osteoarthritis, unspecified site: Secondary | ICD-10-CM

## 2019-02-26 DIAGNOSIS — I499 Cardiac arrhythmia, unspecified: Secondary | ICD-10-CM

## 2019-02-26 DIAGNOSIS — I129 Hypertensive chronic kidney disease with stage 1 through stage 4 chronic kidney disease, or unspecified chronic kidney disease: Secondary | ICD-10-CM

## 2019-02-26 DIAGNOSIS — Z8673 Personal history of transient ischemic attack (TIA), and cerebral infarction without residual deficits: Secondary | ICD-10-CM

## 2019-02-26 DIAGNOSIS — Z85528 Personal history of other malignant neoplasm of kidney: Secondary | ICD-10-CM

## 2019-02-26 DIAGNOSIS — H547 Unspecified visual loss: Secondary | ICD-10-CM

## 2019-02-26 DIAGNOSIS — Z905 Acquired absence of kidney: Secondary | ICD-10-CM

## 2019-02-26 DIAGNOSIS — J438 Other emphysema: Secondary | ICD-10-CM

## 2019-02-26 DIAGNOSIS — N189 Chronic kidney disease, unspecified: Secondary | ICD-10-CM

## 2019-02-26 DIAGNOSIS — C649 Malignant neoplasm of unspecified kidney, except renal pelvis: Secondary | ICD-10-CM

## 2019-02-26 DIAGNOSIS — N281 Cyst of kidney, acquired: Secondary | ICD-10-CM

## 2019-02-26 DIAGNOSIS — R972 Elevated prostate specific antigen [PSA]: Secondary | ICD-10-CM

## 2019-02-26 MED ORDER — HEPARIN, PORCINE (PF) 100 UNIT/ML IV SYRG
500 [IU] | Freq: Once | 0 refills | Status: CP
Start: 2019-02-26 — End: ?

## 2019-02-26 NOTE — Progress Notes
Name: Chris Lambert          MRN: [id number]      DOB: 01-20-1946      AGE: 73 y.o.   DATE OF SERVICE: 02/26/2019    Subjective:             Reason for Visit:  Follow Up    Chris Lambert is a 73 y.o. male.     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 Biopsy = Poorly differentiated adenocarcinoma. positive for CK7 and negative for CK20, TTF-1, CDX-2, PAX-8, NKX 3.1, Hep-par1 and arginase-1. This immunoprofile is not specific but is compatible with upper gastrointestinal or pancreaticobiliary (including intrahepatic cholangiocarcinoma) origins. ???colonoscopy and EGD negative for original tumor. Seen by hepatobiliary surgery and recommended chemotherapy prior to any surgery. No surgery at this time due to current size and location of tumor. Imaging negative for distant metastatic disease. Pt agreed to chemotherapy. Port placed. Plan on gemcitabine and oxaliplatin (given renal failure, unable to use cisplatin). C1 started on 07/28/18      Interval history:  He is feeling pretty good and tolerating chemo well. He is breathing better and moving around better. Has lost 3lbs of wt since 2 weeks. He is dizzy and fell this AM. His appetite is fair but food doesn't taste very good. He denies N/V or diarrhea. His back is hurting a little more and increased hydrocodone is helping. He is constipated. The numbness is a little better. He bruises easily but denies frank bleeding. He has seen urology and was told things are stable.   HIs Cr. Is higherr.       Review of Systems   Constitutional: Positive for fatigue. Negative for activity change, appetite change, chills, diaphoresis, fever and unexpected weight change.   HENT: Negative for congestion, drooling, facial swelling, mouth sores, nosebleeds, postnasal drip, sore throat and trouble swallowing.    Eyes: Negative.  Negative for visual disturbance.   Respiratory: Positive for shortness of breath (with exertion, better this week). Negative for cough, chest tightness and wheezing.    Cardiovascular: Positive for leg swelling (better). Negative for chest pain and palpitations.   Gastrointestinal: Negative.  Negative for abdominal distention, abdominal pain, anal bleeding, blood in stool, constipation, diarrhea, nausea, rectal pain and vomiting.   Endocrine: Positive for cold intolerance.   Genitourinary: Negative.  Negative for decreased urine volume, difficulty urinating, dysuria, frequency and hematuria.   Musculoskeletal: Positive for arthralgias and back pain. Negative for gait problem, joint swelling, myalgias and neck pain.   Skin: Negative for color change, pallor, rash and wound.   Neurological: Positive for dizziness, weakness and numbness. Negative for light-headedness and headaches.   Psychiatric/Behavioral: Negative.  Negative for agitation, decreased concentration and sleep disturbance. The patient is not nervous/anxious.      Medical History:   Diagnosis Date   ??? Arrhythmia     A fibrillation briefly s/p CABG   ??? Arthritis    ??? Back pain    ??? Cancer of hepatic duct (HCC)     2019   ??? Cancer of kidney (HCC)    ??? CKD (chronic kidney disease)    ??? Coronary artery disease    ??? Elevated PSA    ??? Gastrointestinal disorder    ??? Heart disease    ???  Hyperlipidemia    ??? Hypertension    ??? Other emphysema (HCC)    ??? Polycythemia    ??? Renal mass    ??? Stomach disorder    ??? Stroke (HCC)     2014   ??? Stroke [facility])    ??? Vision decreased      Surgical History:   Procedure Laterality Date   ??? NEPHRECTOMY  2018    RIGHT WAS REMOVED   ??? ROBOT-ASSISTED LAPAROSCOPIC RADICAL NEPHRECTOMY; INTRAOPERATIVE ULTRASOUND. Right 07/26/2017    Performed by Earl Many, MD at [facility] OR   ??? ESOPHAGOGASTRODUODENOSCOPY WITH TRANSENDOSCOPIC ULTRASOUND GUIDED FINE NEEDLE ASPIRATION/ BIOPSY N/A 07/14/2018    Performed by Bernita Buffy, MD at [facility] OR   ??? COLONOSCOPY WITH BIOPSY - FLEXIBLE N/A 07/14/2018    Performed by Bernita Buffy, MD at [facility] OR   ??? ESOPHAGOGASTRODUODENOSCOPY WITH BIOPSY - FLEXIBLE N/A 07/14/2018    Performed by Bernita Buffy, MD at [id number] OR   ??? HX CORONARY ARTERY BYPASS GRAFT     ??? HX NEPHRECTOMY     ??? MOUTH SURGERY       Social History     Socioeconomic History   ??? Marital status: Married     Spouse name: Not on file   ??? Number of children: Not on file   ??? Years of education: Not on file   ??? Highest education level: Not on file   Occupational History   ??? Not on file   Tobacco Use   ??? Smoking status: Former Smoker     Last attempt to quit: 2012     Years since quitting: 8.5   ??? Smokeless tobacco: Never Used   Substance and Sexual Activity   ??? Alcohol use: No     Comment: Quit in 1986    ??? Drug use: No   ??? Sexual activity: Not Currently     Partners: Female   Other Topics Concern   ??? Not on file   Social History Narrative   ??? Not on file     No Known Allergies      Objective:         ??? acetaminophen (TYLENOL) 500 mg tablet Take 1,000 mg by mouth every 6 hours as needed for Pain. Max of 4,000 mg of acetaminophen in 24 hours.   ??? ADVAIR HFA 230-21 mcg/actuation inhaler INHALE 2 PUFFS TWICE DAILY RINSE MOUTH AND THROAT AFTER USE   ??? amLODIPine (NORVASC) 10 mg tablet Take 10 mg by mouth daily with breakfast.   ??? aspirin EC 81 mg tablet Take 81 mg by mouth daily with breakfast. Take with food.   ??? atorvastatin (LIPITOR) 20 mg tablet Take 20 mg by mouth daily.   ??? calcium carbonate (TUMS) 500 mg (200 mg elemental calcium) chewable tablet Chew 500-1,000 mg by mouth daily as needed.   ??? carvedilol (COREG) 25 mg tablet Take one tablet by mouth twice daily with meals. Take with food.   ??? dexAMETHasone (DECADRON) 4 mg tablet 1 tablet PO BID with meals on days 2 & 3 following chemotherapy. Then take one tab every morning. Do not take after 6 pm to avoid insomnia.   ??? docusate (COLACE) 100 mg capsule Take 100 mg by mouth daily with breakfast.   ??? finasteride (PROSCAR) 5 mg tablet Take 5 mg by mouth daily with breakfast.   ??? furosemide (LASIX) 40 mg tablet 60 mg every morning.   ??? HYDROcodone/acetaminophen (NORCO) 10/325 mg tablet Take  one tablet to two tablets by mouth every 6 hours as needed for Pain   ??? naphazoline HCl (CLEAR EYES OP) Apply 1-2 drops to both eyes daily as needed.   ??? ondansetron (ZOFRAN) 8 mg tablet 1 tablet PO BID with meals on days 2 & 3 following chemotherapy. Then use q 8 h PRN nausea or vomiting.   ??? polyethylene glycol 3350 (GLYCOLAX; MIRALAX) 17 gram/dose powder Take seventeen g by mouth daily.   ??? prochlorperazine maleate (COMPAZINE) 10 mg tablet 1 tablet q 6 h PRN for nausea not controlled by Zofran.   ??? tamsulosin (FLOMAX) 0.4 mg capsule Take 0.4 mg by mouth daily with dinner.   ??? vitamins, multiple cap Take 1 capsule by mouth daily.     Vitals:    02/26/19 0900   BP: (!) 150/95   BP Source: Arm, Left Upper   Pulse: 97   Resp: 16   SpO2: 97%   Weight: 84.4 kg (186 lb)   Height: 180.3 cm (71)     Body mass index is 25.94 kg/m???.                  Pain Addressed:  Current regimen working to control pain.     Patient Evaluated for a Clinical Trial: No treatment clinical trial available for this patient.     [place] Cooperative Oncology Group performance status is 2, Ambulatory and capable of all selfcare but unable to carry out any work activities. Up and about more than 50% of waking hours.     Physical Exam  Vitals signs reviewed.   Constitutional:       General: He is not in acute distress.     Appearance: Normal appearance. He is well-developed. He is not ill-appearing, toxic-appearing or diaphoretic.   HENT:      Head: Normocephalic and atraumatic.      Nose: Nose normal. No congestion or rhinorrhea.      Mouth/Throat:      Mouth: Mucous membranes are not pale, not dry and not cyanotic. No oral lesions. Pharynx: Oropharynx is clear. No oropharyngeal exudate or posterior oropharyngeal erythema.   Eyes:      General: No scleral icterus.        Right eye: No discharge.         Left eye: No discharge.      Extraocular Movements: Extraocular movements intact.      Conjunctiva/sclera: Conjunctivae normal.      Pupils: Pupils are equal, round, and reactive to light.   Neck:      Musculoskeletal: Normal range of motion and neck supple. No neck rigidity or muscular tenderness.   Cardiovascular:      Rate and Rhythm: Normal rate and regular rhythm.      Pulses: Normal pulses.      Heart sounds: Murmur present. No gallop.    Pulmonary:      Effort: No accessory muscle usage or respiratory distress.      Breath sounds: No stridor. Examination of the right-lower field reveals decreased breath sounds. Examination of the left-lower field reveals decreased breath sounds. Decreased breath sounds present. No wheezing, rhonchi or rales.      Comments: Port site healed  Chest:      Chest wall: No tenderness.   Abdominal:      General: Bowel sounds are normal. There is no distension.      Palpations: Abdomen is soft. There is no mass.      Tenderness: There  is no abdominal tenderness. There is no guarding or rebound.   Musculoskeletal:         General: No swelling, tenderness or deformity.      Right lower leg: No edema.      Left lower leg: No edema.      Comments: Ambulating with cane   Lymphadenopathy:      Cervical: No cervical adenopathy.   Skin:     General: Skin is warm and dry.      Coloration: Skin is not jaundiced or pale.      Findings: Bruising (arms) present. No erythema, lesion or rash.   Neurological:      General: No focal deficit present.      Mental Status: He is alert and oriented to person, place, and time. Mental status is at baseline.      Cranial Nerves: No cranial nerve deficit.      Motor: No weakness.      Coordination: Coordination normal.      Gait: Gait normal.   Psychiatric: Mood and Affect: Mood normal.         Behavior: Behavior normal.         Thought Content: Thought content normal.         Judgment: Judgment normal.             Results for orders placed or performed in visit on 02/26/19 (from the past 336 hour(s))   CBC AND DIFF   Result Value Ref Range    White Blood Cells 14.6 (H) 4.5 - 11.0 K/UL    RBC 3.64 (L) 4.4 - 5.5 M/UL    Hemoglobin 11.4 (L) 13.5 - 16.5 GM/DL    Hematocrit 16.1 (L) 40 - 50 %    MCV 94.9 80 - 100 FL    MCH 31.4 26 - 34 PG    MCHC 33.1 32.0 - 36.0 G/DL    RDW 09.6 (H) 11 - 15 %    Platelet Count 165 150 - 400 K/UL    MPV 8.3 7 - 11 FL    Segmented Neutrophils 84 (H) 41 - 77 %    Lymphocytes 12 (L) 24 - 44 %    Monocytes 2 (L) 4 - 12 %    Metamyelocyte 2 %    ANISO PRESENT     POIK PRESENT     POLY PRESENT     Ovalocyte PRESENT     Platelet Estimate NORMAL     Absolute Neutrophil Count Manual 12.26 (H) 1.8 - 7.0 K/UL     Lab Results   Component Value Date    CEA 3.6 (H) 02/14/2019    CEA 2.5 01/29/2019    CEA 3.0 (H) 01/16/2019    CEA 3.4 (H) 01/01/2019    CEA 3.1 (H) 12/18/2018    CEA 2.2 12/04/2018    CEA 1.6 11/20/2018    CEA 1.1 11/06/2018    CEA 1.3 10/23/2018    CEA 1.5 10/09/2018    CA199 51 (H) 02/14/2019    CA199 49 (H) 01/29/2019    CA199 55 (H) 01/16/2019    CA199 63 (H) 01/01/2019    CA199 54 (H) 12/18/2018    CA199 51 (H) 12/04/2018    CA199 36 (H) 11/20/2018    CA199 27 11/06/2018    CA199 29 10/23/2018    CA199 38 (H) 10/09/2018    PSA 4.64 05/15/2018     US RENAL BLADDER LTD  Narrative: RENAL ULTRASOUND  CLINICAL INDICATION: Male, 73 years old; malignant neoplasm of right kidney.    TECHNIQUE: Multiple grayscale ultrasound images were obtained through the kidneys and urinary bladder.    COMPARISON: CT abdomen/pelvis 09/22/2018. Renal ultrasound 11/14/2017    FINDINGS:    Right kidney: Prior right nephrectomy. No mass in the right renal fossa.    Left kidney: Measures 18.0 cm. There are numerous simple and mildly complex cysts in the left kidney. The largest simple cyst measures 4.7 x 3.7 cm in the lower pole of the left kidney There is a small hypoechoic area in the inferior pole of the left kidney that measures 2.4 x 1.5 x 1.9 cm. Minimal blood flow is noted immediately adjacent to the inner surface of the nodule but no definite internal blood flow seen within the nodule...  No hydronephrosis.    Urinary bladder: Normal. Mild prostate enlargement.  Impression: 1.  Prior right nephrectomy without visualized recurrent mass in the right renal fossa.  2.  Multiple simple and hemorrhagic left renal cysts.  3.  Hypoechoic mass in the inferior pole of the left kidney measuring up to 2.4 cm with peripheral blood flow. This is nonspecific and may represent an additional hemorrhagic cyst. However, a neoplastic process cannot be excluded. An MRI with renal mass protocol could be obtained for further evaluation.    By my electronic signature, I attest that I have personally reviewed the images for this examination and formulated the interpretations and opinions expressed in this report     Finalized by Mallie Darting, M.D. on 02/12/2019 2:58 PM. Dictated by Twana First, M.D. on 02/12/2019 2:36 PM.           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 positive for CK7 and negative for CK20, TTF-1, CDX-2, PAX-8, NKX 3.1, Hep-par1 and arginase-1. This immunoprofile is not specific but is compatible with upper gastrointestinal or pancreaticobiliary (including intrahepatic cholangiocarcinoma) origins. ???colonoscopy and EGD negative for original tumor. Seen by hepatobiliary surgery and recommended chemotherapy prior to any surgery. No surgery at this time due to current size and location of tumor. Imaging negative for distant metastatic disease. Pt agreed to chemotherapy. Port placed. Plan on gemcitabine and oxaliplatin (given renal failure, unable to use cisplatin). TEMPUS with IDH1 mutation, p73m kdm6A. s/p C4 of Gem+oxaliplatin. CT 09/25/18 = Decrease in size of hypodense mass in segment 4 of the liver. Decrease Oxaliplatin to 65 mg/m2 for C5 and reassess further dosing if creatinine improves. Continue reduced doses  Plan:  S/p Cycle 10 Gem Ox on 12/04/18  CT 12/15/18 without contrast = stable disease.  Was Tolerating chemo well  S/p Cycle 13 02/14/19.  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  covid negative, continue monthly    Cr. High, dizzy/fall today, AST/ALT high, hold chemo today 02/26/19  All time for recovery. Check Orthostatics, F/u with Nephrology to adjust diuretics.  RTC in 2 weeks with CT scans and tentative chemo    Orthostatics today 02/26/19  Laying BP 144/97, HR 95  Sitting 137/88 and HR 99  Standing 148/81 and HR 104    2. 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 Path = Three foci of renal papillary carcinoma are present: Largest tumor measures 7.3 x 6.9 x 6.2 cm. Additional tumors measure 4.1 cm and 1.9 cm.  Decline in renal function and follows with Dr. Rocco Serene in  nephrology and Dr. Perlie Gold in urology.   ???  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 ???He follows with a hematologist at [facility] in [place], [place]. No longer on hydroxyuria or having phlebotomy. On aspirin. More bruising and skin tears, now healing with break in chemo. Consider holding aspirin in future if this continues.     4. Hypertension. currently managed with carvedilol 25 mg bid.and amlodipine. BP elevated in clinic. BP has been running 140s/80s at home. 157/87 today 2/17. F/u with Nephrology. He was started on HCTZ on 09/14/18 by PCP and stopped on 09/22/18 HCTZ due to polyuria. Hospitalized 5/13 at [facility] with his local cardiologist for fluid overload. BP about the same but managed and followed by cardiology. 5. Stage 4 CKD. He had preexisting CKD; unclear etiology. Creatinine was 1.8 mg/dl and now baseline serum creatinine about 3 mg/dl. Follows with Dr. Rocco Serene. Today it is 3.93 and BUN 96 02/26/19. Diuretics adjusted. Weight down, breathing and ambulating better. Continue to follow with renal and urology.     6. Nausea.  From Chemo. controlled with PRN zofran, and compazine. Denies today.    7. Fatigue. From chemo, tolerable at grade one. Better with decadron. Worse with fluid overload. Better today and ready to continue       8. Cold sensitivities. mild, however lasting almost 2 weeks.now persistent numbness and tingling (painful) in his feet. Hold oxaliplatin today, reassess in 2 weeks. Ok to use hydrocodone prn for this pain.   ???  9. Weight loss. From cold sensitivity and dysgeusia. Stable today       10. Back pain. Chronic. Worse with cancer. Tramadol not helping any more. Switch to hydrocodone. Pt is reluctant to start oxycodone due to history of alcoholism. He uses 2 pain pills in the am and one in the pm before bed. Stable. Refill today  ???  11. Edema. Renal and cardiac disease. now better.           Vanita Ingles, MD

## 2019-02-27 ENCOUNTER — Encounter: Admit: 2019-02-27 | Discharge: 2019-02-27

## 2019-02-27 DIAGNOSIS — H547 Unspecified visual loss: Secondary | ICD-10-CM

## 2019-02-27 DIAGNOSIS — J438 Other emphysema: Secondary | ICD-10-CM

## 2019-02-27 DIAGNOSIS — I499 Cardiac arrhythmia, unspecified: Secondary | ICD-10-CM

## 2019-02-27 DIAGNOSIS — K319 Disease of stomach and duodenum, unspecified: Secondary | ICD-10-CM

## 2019-02-27 DIAGNOSIS — M549 Dorsalgia, unspecified: Secondary | ICD-10-CM

## 2019-02-27 DIAGNOSIS — I519 Heart disease, unspecified: Secondary | ICD-10-CM

## 2019-02-27 DIAGNOSIS — I2510 Atherosclerotic heart disease of native coronary artery without angina pectoris: Secondary | ICD-10-CM

## 2019-02-27 DIAGNOSIS — K929 Disease of digestive system, unspecified: Secondary | ICD-10-CM

## 2019-02-27 DIAGNOSIS — C240 Malignant neoplasm of extrahepatic bile duct: Secondary | ICD-10-CM

## 2019-02-27 DIAGNOSIS — N189 Chronic kidney disease, unspecified: Secondary | ICD-10-CM

## 2019-02-27 DIAGNOSIS — D751 Secondary polycythemia: Secondary | ICD-10-CM

## 2019-02-27 DIAGNOSIS — M1990 Unspecified osteoarthritis, unspecified site: Secondary | ICD-10-CM

## 2019-02-27 DIAGNOSIS — C649 Malignant neoplasm of unspecified kidney, except renal pelvis: Secondary | ICD-10-CM

## 2019-02-27 DIAGNOSIS — R972 Elevated prostate specific antigen [PSA]: Secondary | ICD-10-CM

## 2019-02-27 DIAGNOSIS — E785 Hyperlipidemia, unspecified: Secondary | ICD-10-CM

## 2019-02-27 DIAGNOSIS — I639 Cerebral infarction, unspecified: Secondary | ICD-10-CM

## 2019-02-27 DIAGNOSIS — I10 Essential (primary) hypertension: Secondary | ICD-10-CM

## 2019-02-27 DIAGNOSIS — N2889 Other specified disorders of kidney and ureter: Secondary | ICD-10-CM

## 2019-03-01 ENCOUNTER — Encounter: Admit: 2019-03-01 | Discharge: 2019-03-01

## 2019-03-01 NOTE — Progress Notes
Call to nephrology office , [place] . Left VM requesting urgent appt for this Patient . Requested C/B today . APPT for adjustment of diuretics due to dizziness and dehydration .

## 2019-03-02 ENCOUNTER — Encounter: Admit: 2019-03-02 | Discharge: 2019-03-02

## 2019-03-02 NOTE — Telephone Encounter
Received call from oncology about patient creatinine being elevated earlier this week and patient fell and having dizziness at treatment appointment. Left message x2 then spoke with wife. Patient currently taking metolazone 5mg  MWF and lasix 40mg  daily. Weight down to 181 from 185. Did not take metolazone this am only 40mg  of lasix. Will discuss with Dr Dondra Prader to determine if he wants to continue to hold metolazone and adjust lasix for swelling. Will repeat labs next week once symptoms resolve and weight back up. Advised patient to drink to thirst. Will record daily weights and bp and contact first of next week to review and make further adjustments.

## 2019-03-05 ENCOUNTER — Encounter: Admit: 2019-03-05 | Discharge: 2019-03-05

## 2019-03-05 LAB — COVID-19 (SARS-COV-2) PCR

## 2019-03-05 MED ORDER — DEXAMETHASONE 4 MG PO TAB
4 mg | Freq: Once | ORAL | 0 refills | Status: CN
Start: 2019-03-05 — End: ?

## 2019-03-05 MED ORDER — GEMCITABINE IVPB
1000 mg/m2 | Freq: Once | INTRAVENOUS | 0 refills | Status: CN
Start: 2019-03-05 — End: ?

## 2019-03-05 MED ORDER — ONDANSETRON HCL 8 MG PO TAB
16 mg | Freq: Once | ORAL | 0 refills | Status: CN
Start: 2019-03-05 — End: ?

## 2019-03-06 ENCOUNTER — Encounter: Admit: 2019-03-06 | Discharge: 2019-03-06

## 2019-03-06 NOTE — Telephone Encounter
Call received Vickki Muff, APRN and left update on this Patient . Nephrology attempted to call [facility] to schedule an appt w/o success . Patient is now hospitalized at local hospital in [place] for emphysema . O2 sat's 80's . Patient continues to have low appetite , w/ poor intake . Per APRN , Patient will be evaluated and monitored w/ call in to evaluate appropriate reentry of diuretics. This office will  request images and records when DC from hospital. Covid test done inpt.

## 2019-03-06 NOTE — Telephone Encounter
Call to Nephrology, left VM for [facility] regarding recommendations from their office. Requested update .

## 2019-03-12 ENCOUNTER — Encounter: Admit: 2019-03-12 | Discharge: 2019-03-12

## 2019-03-12 NOTE — Telephone Encounter
Discharged from [facility] hospital 03/08/19 . Discharged w/ home O2 fro sleep 2l  per nc . He remains w/ generalized weak . Request cancel scan appointment CT C/A/P for appointments to be delayed till prior to 8/3 appointment due to slowly regain of strength .  Patient does not believe they did scans during admission but di have a covid test which was negative .Records requested. Appointment changes done

## 2019-03-15 ENCOUNTER — Encounter: Admit: 2019-03-15 | Discharge: 2019-03-15

## 2019-03-16 ENCOUNTER — Encounter: Admit: 2019-03-16 | Discharge: 2019-03-16

## 2019-03-16 DIAGNOSIS — Z1159 Encounter for screening for other viral diseases: Secondary | ICD-10-CM

## 2019-03-20 ENCOUNTER — Encounter: Admit: 2019-03-20 | Discharge: 2019-03-20

## 2019-03-20 NOTE — Telephone Encounter
called patient to get him scheduled for a COVID screen prior to his [place] on 8/3. Patient declined to do it so i will let the nurse know about his decision.

## 2019-03-26 ENCOUNTER — Encounter: Admit: 2019-03-26 | Discharge: 2019-03-26

## 2019-04-01 ENCOUNTER — Encounter: Admit: 2019-04-01 | Discharge: 2019-04-01

## 2019-04-01 DIAGNOSIS — J438 Other emphysema: Secondary | ICD-10-CM

## 2019-04-01 DIAGNOSIS — K319 Disease of stomach and duodenum, unspecified: Secondary | ICD-10-CM

## 2019-04-01 DIAGNOSIS — I519 Heart disease, unspecified: Secondary | ICD-10-CM

## 2019-04-01 DIAGNOSIS — M1990 Unspecified osteoarthritis, unspecified site: Secondary | ICD-10-CM

## 2019-04-01 DIAGNOSIS — I499 Cardiac arrhythmia, unspecified: Secondary | ICD-10-CM

## 2019-04-01 DIAGNOSIS — N2889 Other specified disorders of kidney and ureter: Secondary | ICD-10-CM

## 2019-04-01 DIAGNOSIS — M549 Dorsalgia, unspecified: Secondary | ICD-10-CM

## 2019-04-01 DIAGNOSIS — H547 Unspecified visual loss: Secondary | ICD-10-CM

## 2019-04-01 DIAGNOSIS — C240 Malignant neoplasm of extrahepatic bile duct: Secondary | ICD-10-CM

## 2019-04-01 DIAGNOSIS — I2510 Atherosclerotic heart disease of native coronary artery without angina pectoris: Secondary | ICD-10-CM

## 2019-04-01 DIAGNOSIS — D751 Secondary polycythemia: Secondary | ICD-10-CM

## 2019-04-01 DIAGNOSIS — I639 Cerebral infarction, unspecified: Secondary | ICD-10-CM

## 2019-04-01 DIAGNOSIS — E785 Hyperlipidemia, unspecified: Secondary | ICD-10-CM

## 2019-04-01 DIAGNOSIS — C649 Malignant neoplasm of unspecified kidney, except renal pelvis: Secondary | ICD-10-CM

## 2019-04-01 DIAGNOSIS — K929 Disease of digestive system, unspecified: Secondary | ICD-10-CM

## 2019-04-01 DIAGNOSIS — N189 Chronic kidney disease, unspecified: Secondary | ICD-10-CM

## 2019-04-01 DIAGNOSIS — I10 Essential (primary) hypertension: Secondary | ICD-10-CM

## 2019-04-01 DIAGNOSIS — R972 Elevated prostate specific antigen [PSA]: Secondary | ICD-10-CM

## 2019-04-02 NOTE — Progress Notes
Date of Service: 02/12/2019     Subjective:             Chris Lambert is a 73 y.o. male who presents today for follow-up of kidney cancer.  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    History of Present Illness  Mr. Chris Lambert is a 73 year old male with a history of pT2a papillary renal cell carcinoma who presents for follow-up.  He underwent right radical nephrectomy on 07/26/17.  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  He is currently being treated with chemotherapy by Dr. Josiah Lobo.  His treatment has been complicated by CHF exacerbations.    He does report a pain in his back near the site of his prior nephrectomy on the RIGHT.  This is also the site of his current liver disease.      The patient denies hematuria, dysuria, recent fevers/chills.  He has had some weight loss.    He is taking tamsulosin for many years and has also been taking finasteride.         Review of Systems   Constitutional: Negative for fever and unexpected weight change.   HENT: Negative for nosebleeds, tinnitus and trouble swallowing.    Eyes: Negative for discharge and visual disturbance.   Respiratory: Negative for chest tightness and shortness of breath.    Cardiovascular: Negative for chest pain and palpitations.   Gastrointestinal: Negative for abdominal pain, blood in stool, constipation, diarrhea, nausea and vomiting.   Genitourinary: Negative for difficulty urinating, dysuria, flank pain, hematuria and urgency.   Musculoskeletal: Negative for back pain, joint swelling and myalgias.   Skin: Negative for rash.   Neurological: Negative for seizures, syncope and headaches.   Hematological: Negative for adenopathy. Does not bruise/bleed easily.   Psychiatric/Behavioral: Negative for confusion.         Objective:         ??? acetaminophen (TYLENOL) 500 mg tablet Take 1,000 mg by mouth every 6 hours as needed for Pain. Max of 4,000 mg of acetaminophen in 24 hours.   ??? ADVAIR HFA 230-21 mcg/actuation inhaler INHALE 2 PUFFS TWICE DAILY RINSE MOUTH AND THROAT AFTER USE   ??? amLODIPine (NORVASC) 10 mg tablet Take 10 mg by mouth daily with breakfast.   ??? aspirin EC 81 mg tablet Take 81 mg by mouth daily with breakfast. Take with food.   ??? atorvastatin (LIPITOR) 20 mg tablet Take 20 mg by mouth daily.   ??? calcium carbonate (TUMS) 500 mg (200 mg elemental calcium) chewable tablet Chew 500-1,000 mg by mouth daily as needed.   ??? carvedilol (COREG) 25 mg tablet Take one tablet by mouth twice daily with meals. Take with food.   ??? dexAMETHasone (DECADRON) 4 mg tablet 1 tablet PO BID with meals on days 2 & 3 following chemotherapy. Then take one tab every morning. Do not take after 6 pm to avoid insomnia.   ??? docusate (COLACE) 100 mg capsule Take 100 mg by mouth daily with breakfast.   ??? finasteride (PROSCAR) 5 mg tablet Take 5 mg by mouth daily with breakfast.   ??? furosemide (LASIX) 40 mg tablet 60 mg every morning.   ??? HYDROcodone/acetaminophen (NORCO) 10/325 mg tablet Take one tablet to two tablets by mouth every 6 hours as needed for Pain   ??? naphazoline HCl (CLEAR EYES OP) Apply 1-2 drops to both eyes daily as needed.   ??? ondansetron (ZOFRAN) 8 mg  tablet 1 tablet PO BID with meals on days 2 & 3 following chemotherapy. Then use q 8 h PRN nausea or vomiting.   ??? polyethylene glycol 3350 (GLYCOLAX; MIRALAX) 17 gram/dose powder Take seventeen g by mouth daily.   ??? prochlorperazine maleate (COMPAZINE) 10 mg tablet 1 tablet q 6 h PRN for nausea not controlled by Zofran.   ??? tamsulosin (FLOMAX) 0.4 mg capsule Take 0.4 mg by mouth daily with dinner.   ??? vitamins, multiple cap Take 1 capsule by mouth daily.     Vitals:    02/12/19 1508   BP: 134/86   BP Source: Arm, Right Upper   Patient Position: Sitting   Pulse: 105   Weight: 85.5 kg (188 lb 9.6 oz)   Height: 180.3 cm (71)   PainSc: Eight Body mass index is 26.3 kg/m???.     Physical Exam  Constitutional:       Appearance: He is well-developed.      Comments: Weight loss noted   HENT:      Head: Normocephalic and atraumatic.   Eyes:      General: No scleral icterus.     Conjunctiva/sclera: Conjunctivae normal.   Cardiovascular:      Rate and Rhythm: Normal rate.   Pulmonary:      Effort: Pulmonary effort is normal. No respiratory distress.   Abdominal:      General: There is no distension.      Palpations: Abdomen is soft. There is no mass.      Tenderness: There is no abdominal tenderness. There is no guarding or rebound.      Comments: Abdominal port sites well-healed.   Musculoskeletal: Normal range of motion.         General: No deformity.   Skin:     General: Skin is warm and dry.      Comments: Focal right index finger swelling without surrounding erythema or drainage.    Neurological:      Mental Status: He is alert and oriented to person, place, and time.   Psychiatric:         Behavior: Behavior normal.           Comprehensive Metabolic Profile    Lab Results   Component Value Date/Time    NA 134 (L) 02/26/2019 08:43 AM    K 4.4 02/26/2019 08:43 AM    CL 93 (L) 02/26/2019 08:43 AM    CO2 30 02/26/2019 08:43 AM    GAP 11 02/26/2019 08:43 AM    BUN 96 (H) 02/26/2019 08:43 AM    CR 3.93 (H) 02/26/2019 08:43 AM    GLU 119 (H) 02/26/2019 08:43 AM    Lab Results   Component Value Date/Time    CA 9.4 02/26/2019 08:43 AM    PO4 4.7 (H) 01/01/2019 09:55 AM    ALBUMIN 3.3 (L) 02/26/2019 08:43 AM    TOTPROT 5.9 (L) 02/26/2019 08:43 AM    ALKPHOS 103 02/26/2019 08:43 AM    AST 105 (H) 02/26/2019 08:43 AM    ALT 237 (H) 02/26/2019 08:43 AM    TOTBILI 0.6 02/26/2019 08:43 AM    GFR 15 (L) 02/26/2019 08:43 AM    GFRAA 18 (L) 02/26/2019 08:43 AM          Ultrasound (02/12/2019)  IMPRESSION  1. ???Prior right nephrectomy without visualized recurrent mass in the right   renal fossa.  2. ???Multiple simple and hemorrhagic left renal cysts. 3. ???Hypoechoic mass in the inferior pole  of the left kidney measuring up   to 2.4 cm with peripheral blood flow. This is nonspecific and may   represent an additional hemorrhagic cyst. However, a neoplastic process   cannot be excluded. An MRI with renal mass protocol could be obtained for   further evaluation.    CT Scan (12/15/2018)  CHEST:  1. No evidence of thoracic metastatic disease.  2. Mild to moderate emphysema with scattered areas of scarring. ???    ABDOMEN AND PELVIS:  1. No significant change in size of the biopsy-proven adenocarcinoma   within hepatic segment 4.  2. No abdominopelvic lymphadenopathy or ascites.  3. 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  4. Prior right nephrectomy without evidence of right renal fossa mass.  5. Multiple probable simple and hemorrhagic renal cysts, which are   incompletely characterized in the absence of IV contrast.     Assessment and Plan:    Problem   Papillary Renal Cell Carcinoma (Hcc)    07/26/17 right robotic radical nephrectomy, papillary cell carcinoma., pT2a    Three foci of renal papillary carcinoma are present: Largest tumor   measures 7.3 x 6.9 x 6.2 cm   Additional tumors measure 4.1 cm and 1.9 cm.     08/15/17: Cr 2.93    11/14/17: Cr 2.64, CXR negative, Renal US left renal simple and hemorrhagic cysts, right renal fossa NED    05/15/18: Patient doing well. Labs and imaging pending.    02/12/2019: Ultrasound (02/12/2019)  IMPRESSION  1. ???Prior right nephrectomy without visualized recurrent mass in the right   renal fossa.  2. ???Multiple simple and hemorrhagic left renal cysts.  3. ???Hypoechoic mass in the inferior pole of the left kidney measuring up   to 2.4 cm with peripheral blood flow. This is nonspecific and may   represent an additional hemorrhagic cyst. However, a neoplastic process cannot be excluded. An MRI with renal mass protocol could be obtained for   further evaluation.    Patient with questionable LEFT lower pole renal mass, but also being actively treated with chemotherapy for cholangiocarcinoma of the liver.           Papillary renal cell carcinoma (HCC)  Patient overall doing well and tolerating chemotherapy for cholangiocarcinoma of the liver.  He is having some pain on the RIGHT.  Ultrasound shows a questionable LEFT lower pole kidney lesion.   Prior CT scan with stable disease and no signs of metastases.  We discussed options.  He has imaging due to Dr. Josiah Lobo in a few months.  We will plan to obtain an MRI of the kidney in 6 months to further evaluate the new lesion (likely hemorrhagic cyst).  -- F/U with Dr. Josiah Lobo as scheduled  -- F/U with me in 6 months with an MRI prior  -- All questions answered

## 2019-04-02 NOTE — Assessment & Plan Note
Patient overall doing well and tolerating chemotherapy for cholangiocarcinoma of the liver.  He is having some pain on the RIGHT.  Ultrasound shows a questionable LEFT lower pole kidney lesion.   Prior CT scan with stable disease and no signs of metastases.  We discussed options.  He has imaging due to Dr. Eldred Manges in a few months.  We will plan to obtain an MRI of the kidney in 6 months to further evaluate the new lesion (likely hemorrhagic cyst).  -- F/U with Dr. Eldred Manges as scheduled  -- F/U with me in 6 months with an MRI prior  -- All questions answered

## 2019-04-05 ENCOUNTER — Encounter: Admit: 2019-04-05 | Discharge: 2019-04-05

## 2019-04-24 DEATH — deceased

## 2020-02-21 IMAGING — CR CHEST
1 series · 1 of 1 positions shown · non-contrast
Comparison: none

[chest port x-wise]
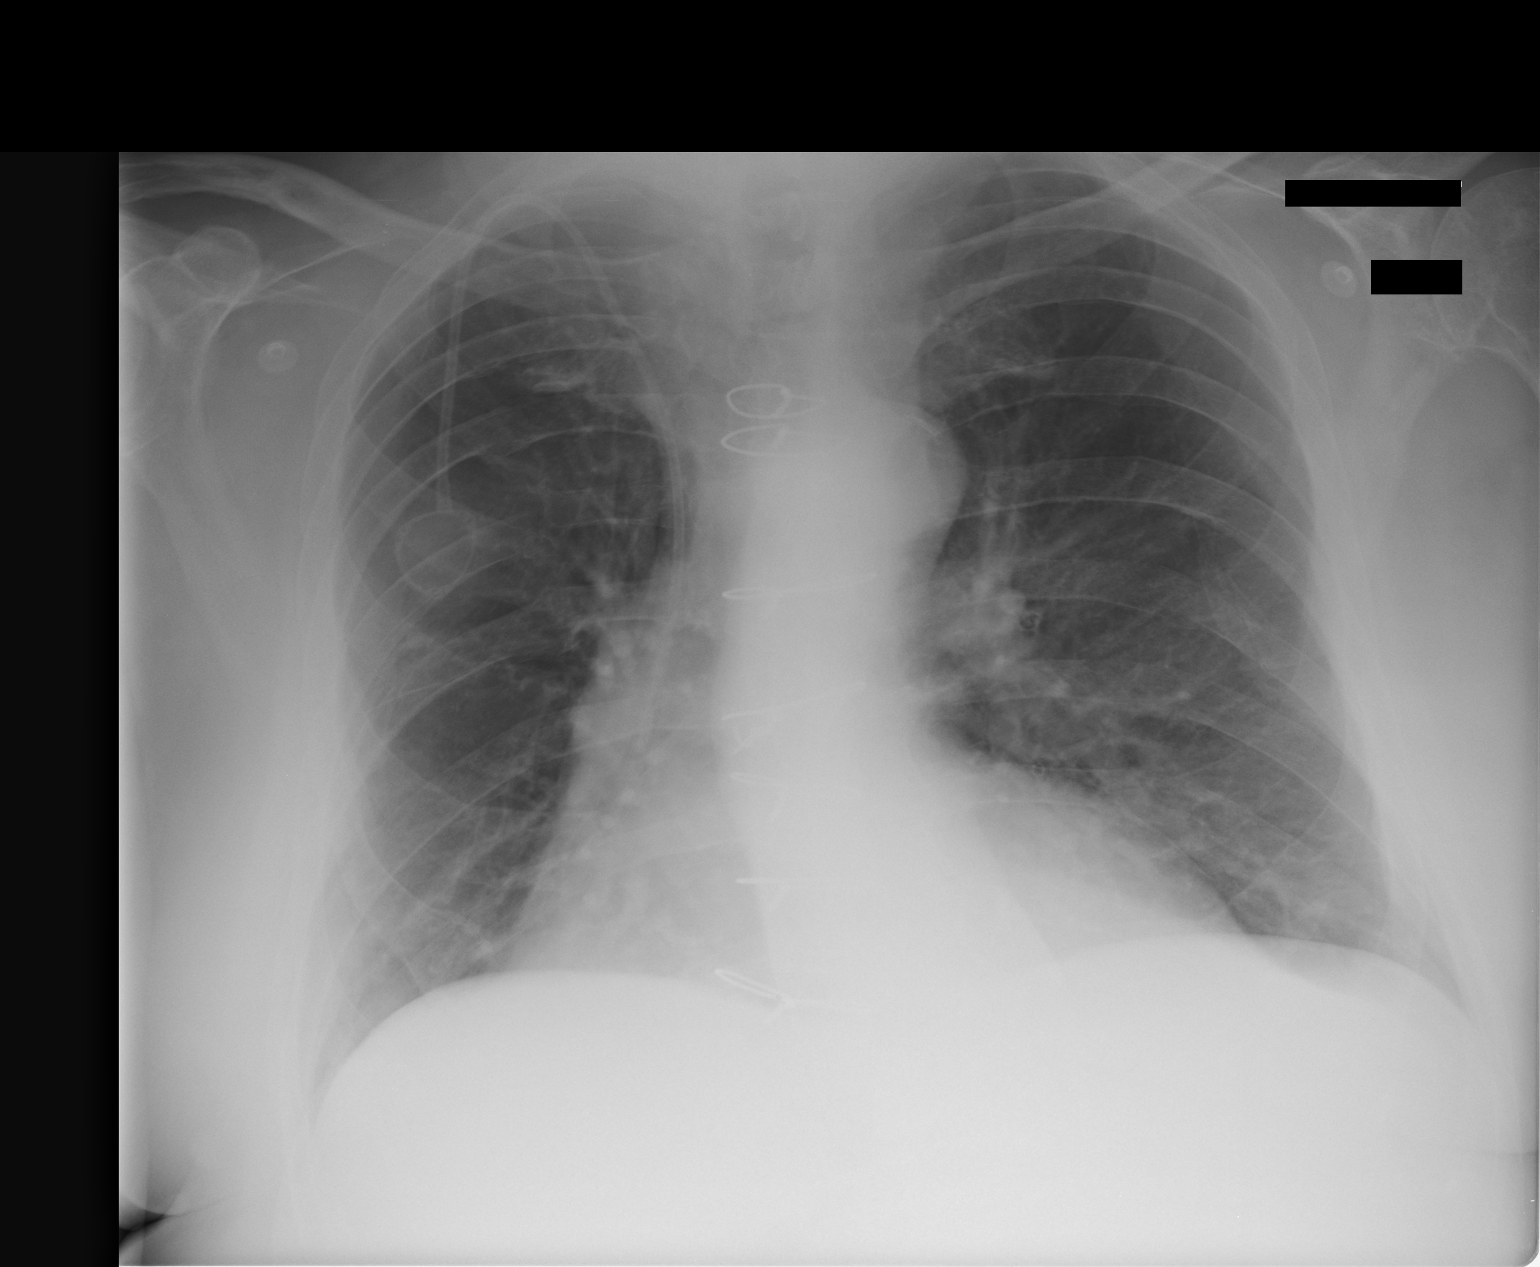

[1 of 1 positions shown; findings below may reference images not displayed]

EXAM

XR chest 1V

INDICATION

Shortness of breath

TECHNIQUE

Single view of the chest

COMPARISONS

None available at the time of dictation.

FINDINGS

No radiographically apparent pleural effusion, consolidation, or pneumothorax. Right anterior chest
Port-A-Cath with the distal tip at the cavoatrial junction.

The cardiomediastinal silhouette is  normal in size. Evidence of prior sternotomy.

The osseous structures are without an acute osseous abnormality.

IMPRESSION
1. No radiographic evidence of an acute cardiopulmonary process.

Tech Notes:

soa
